# Patient Record
Sex: Male | Born: 1955 | Race: Black or African American | Hispanic: No | Marital: Single
Health system: Southern US, Community
[De-identification: ages and names within clinical notes are randomized; demographics above are authoritative.]

---

## 2003-07-04 ENCOUNTER — Other Ambulatory Visit: Payer: Self-pay

## 2003-09-19 ENCOUNTER — Other Ambulatory Visit: Payer: Self-pay

## 2006-06-02 ENCOUNTER — Ambulatory Visit: Payer: Self-pay | Admitting: Internal Medicine

## 2006-06-02 ENCOUNTER — Other Ambulatory Visit: Payer: Self-pay

## 2006-06-03 ENCOUNTER — Inpatient Hospital Stay: Payer: Self-pay | Admitting: Internal Medicine

## 2006-06-18 ENCOUNTER — Ambulatory Visit: Payer: Self-pay | Admitting: Internal Medicine

## 2006-07-17 ENCOUNTER — Emergency Department (HOSPITAL_COMMUNITY): Admission: EM | Admit: 2006-07-17 | Discharge: 2006-07-18 | Payer: Self-pay | Admitting: Emergency Medicine

## 2007-02-26 ENCOUNTER — Other Ambulatory Visit: Payer: Self-pay

## 2007-02-26 ENCOUNTER — Inpatient Hospital Stay: Payer: Self-pay | Admitting: Internal Medicine

## 2008-05-06 ENCOUNTER — Emergency Department: Payer: Self-pay | Admitting: Emergency Medicine

## 2008-06-09 ENCOUNTER — Ambulatory Visit: Payer: Self-pay | Admitting: Internal Medicine

## 2010-02-06 ENCOUNTER — Emergency Department: Payer: Self-pay | Admitting: Emergency Medicine

## 2010-02-15 ENCOUNTER — Ambulatory Visit: Payer: Self-pay | Admitting: Internal Medicine

## 2010-02-20 ENCOUNTER — Ambulatory Visit: Payer: Self-pay | Admitting: Internal Medicine

## 2010-02-22 ENCOUNTER — Ambulatory Visit: Payer: Self-pay

## 2010-02-27 ENCOUNTER — Ambulatory Visit: Payer: Self-pay | Admitting: Internal Medicine

## 2010-03-06 ENCOUNTER — Ambulatory Visit: Payer: Self-pay | Admitting: Internal Medicine

## 2011-07-02 LAB — COMPREHENSIVE METABOLIC PANEL
Anion Gap: 19 — ABNORMAL HIGH (ref 7–16)
BUN: 41 mg/dL — ABNORMAL HIGH (ref 7–18)
Bilirubin,Total: 1.1 mg/dL — ABNORMAL HIGH (ref 0.2–1.0)
Chloride: 88 mmol/L — ABNORMAL LOW (ref 98–107)
Co2: 18 mmol/L — ABNORMAL LOW (ref 21–32)
Glucose: 84 mg/dL (ref 65–99)
Osmolality: 261 (ref 275–301)
SGOT(AST): 120 U/L — ABNORMAL HIGH (ref 15–37)
SGPT (ALT): 61 U/L

## 2011-07-02 LAB — DRUG SCREEN, URINE
Benzodiazepine, Ur Scrn: NEGATIVE (ref ?–200)
Methadone, Ur Screen: NEGATIVE (ref ?–300)
Opiate, Ur Screen: NEGATIVE (ref ?–300)
Tricyclic, Ur Screen: NEGATIVE (ref ?–1000)

## 2011-07-02 LAB — CBC
HCT: 39.8 % — ABNORMAL LOW (ref 40.0–52.0)
HGB: 13.7 g/dL (ref 13.0–18.0)
MCHC: 34.5 g/dL (ref 32.0–36.0)
RDW: 14 % (ref 11.5–14.5)

## 2011-07-02 LAB — URINALYSIS, COMPLETE
Glucose,UR: NEGATIVE mg/dL (ref 0–75)
Ketone: NEGATIVE
Leukocyte Esterase: NEGATIVE
Specific Gravity: 1.006 (ref 1.003–1.030)
Squamous Epithelial: <1

## 2011-07-02 LAB — TSH: Thyroid Stimulating Horm: 9.24 u[IU]/mL — ABNORMAL HIGH

## 2011-07-03 ENCOUNTER — Inpatient Hospital Stay: Payer: Self-pay | Admitting: Specialist

## 2011-07-03 LAB — BASIC METABOLIC PANEL
Anion Gap: 14 (ref 7–16)
BUN: 25 mg/dL — ABNORMAL HIGH (ref 7–18)
Calcium, Total: 8.1 mg/dL — ABNORMAL LOW (ref 8.5–10.1)
Calcium, Total: 8.2 mg/dL — ABNORMAL LOW (ref 8.5–10.1)
Chloride: 98 mmol/L (ref 98–107)
Chloride: 104 mmol/L (ref 98–107)
Co2: 22 mmol/L (ref 21–32)
Co2: 21 mmol/L (ref 21–32)
Creatinine: 1.01 mg/dL (ref 0.60–1.30)
EGFR (African American): >60
EGFR (Non-African Amer.): >60
Glucose: 83 mg/dL (ref 65–99)
Osmolality: 273 (ref 275–301)
Osmolality: 277 (ref 275–301)
Potassium: 3.2 mmol/L — ABNORMAL LOW (ref 3.5–5.1)
Sodium: 137 mmol/L (ref 136–145)

## 2011-07-03 LAB — CK TOTAL AND CKMB (NOT AT ARMC)
CK, Total: 1756 U/L — ABNORMAL HIGH (ref 35–232)
CK, Total: 1121 U/L — ABNORMAL HIGH (ref 35–232)
CK-MB: 6.7 ng/mL — ABNORMAL HIGH (ref 0.5–3.6)
CK-MB: 3.9 ng/mL — ABNORMAL HIGH (ref 0.5–3.6)
CK-MB: 3.4 ng/mL (ref 0.5–3.6)

## 2011-07-03 LAB — T4, FREE: Free Thyroxine: 0.94 ng/dL (ref 0.76–1.46)

## 2011-07-03 LAB — AMMONIA: Ammonia, Plasma: 25 mcmol/L (ref 11–32)

## 2011-07-03 LAB — TROPONIN I: Troponin-I: 0.15 ng/mL — ABNORMAL HIGH

## 2011-07-03 LAB — FOLATE: Folic Acid: 21.9 ng/mL (ref 3.1–100.0)

## 2011-07-04 LAB — LIPID PANEL: VLDL Cholesterol, Calc: 16 mg/dL (ref 5–40)

## 2011-07-04 LAB — CBC WITH DIFFERENTIAL/PLATELET
Basophil #: 0 10*3/uL (ref 0.0–0.1)
Basophil %: 0.5 %
HCT: 38.6 % — ABNORMAL LOW (ref 40.0–52.0)
HGB: 13 g/dL (ref 13.0–18.0)
Lymphocyte #: 0.9 10*3/uL — ABNORMAL LOW (ref 1.0–3.6)
MCH: 32.1 pg (ref 26.0–34.0)
MCHC: 33.6 g/dL (ref 32.0–36.0)
Neutrophil #: 5.6 10*3/uL (ref 1.4–6.5)
Platelet: 117 10*3/uL — ABNORMAL LOW (ref 150–440)
RBC: 4.04 10*6/uL — ABNORMAL LOW (ref 4.40–5.90)
RDW: 14.2 % (ref 11.5–14.5)

## 2011-07-04 LAB — PROTIME-INR: INR: 1.1

## 2011-07-04 LAB — COMPREHENSIVE METABOLIC PANEL
Albumin: 3.3 g/dL — ABNORMAL LOW (ref 3.4–5.0)
Alkaline Phosphatase: 53 U/L (ref 50–136)
Anion Gap: 12 (ref 7–16)
BUN: 14 mg/dL (ref 7–18)
Bilirubin,Total: 1.3 mg/dL — ABNORMAL HIGH (ref 0.2–1.0)
Calcium, Total: 8.6 mg/dL (ref 8.5–10.1)
Osmolality: 274 (ref 275–301)
SGOT(AST): 79 U/L — ABNORMAL HIGH (ref 15–37)
Sodium: 137 mmol/L (ref 136–145)
Total Protein: 8 g/dL (ref 6.4–8.2)

## 2011-07-04 LAB — MAGNESIUM: Magnesium: 2.2 mg/dL

## 2011-07-04 LAB — HEMOGLOBIN A1C: Hemoglobin A1C: 6.1 % (ref 4.2–6.3)

## 2011-08-27 ENCOUNTER — Inpatient Hospital Stay: Payer: Self-pay | Admitting: *Deleted

## 2011-08-27 LAB — TROPONIN I
Troponin-I: 0.23 ng/mL — ABNORMAL HIGH
Troponin-I: 0.22 ng/mL — ABNORMAL HIGH
Troponin-I: 0.2 ng/mL — ABNORMAL HIGH

## 2011-08-27 LAB — COMPREHENSIVE METABOLIC PANEL
Alkaline Phosphatase: 118 U/L (ref 50–136)
Anion Gap: 9 (ref 7–16)
BUN: 13 mg/dL (ref 7–18)
Bilirubin,Total: 0.8 mg/dL (ref 0.2–1.0)
Calcium, Total: 9.7 mg/dL (ref 8.5–10.1)
Chloride: 98 mmol/L (ref 98–107)
Co2: 27 mmol/L (ref 21–32)
Creatinine: 1.11 mg/dL (ref 0.60–1.30)
EGFR (African American): >60
EGFR (Non-African Amer.): >60
Glucose: 123 mg/dL — ABNORMAL HIGH (ref 65–99)
SGOT(AST): 45 U/L — ABNORMAL HIGH (ref 15–37)
Sodium: 134 mmol/L — ABNORMAL LOW (ref 136–145)
Total Protein: 9.5 g/dL — ABNORMAL HIGH (ref 6.4–8.2)

## 2011-08-27 LAB — ETHANOL: Ethanol: <3 mg/dL

## 2011-08-27 LAB — APTT
Activated PTT: 31.7 secs (ref 23.6–35.9)
Activated PTT: 118.3 secs — ABNORMAL HIGH (ref 23.6–35.9)
Activated PTT: 122.7 secs — ABNORMAL HIGH (ref 23.6–35.9)

## 2011-08-27 LAB — CK TOTAL AND CKMB (NOT AT ARMC)
CK, Total: 256 U/L — ABNORMAL HIGH (ref 35–232)
CK, Total: 366 U/L — ABNORMAL HIGH (ref 35–232)
CK-MB: 3.1 ng/mL (ref 0.5–3.6)
CK-MB: 7.6 ng/mL — ABNORMAL HIGH (ref 0.5–3.6)

## 2011-08-27 LAB — CBC
HGB: 15.1 g/dL (ref 13.0–18.0)
MCV: 95 fL (ref 80–100)
RBC: 4.72 10*6/uL (ref 4.40–5.90)
WBC: 11.9 10*3/uL — ABNORMAL HIGH (ref 3.8–10.6)

## 2011-08-27 LAB — TSH: Thyroid Stimulating Horm: 8.71 u[IU]/mL — ABNORMAL HIGH

## 2011-08-27 LAB — T4, FREE: Free Thyroxine: 0.98 ng/dL (ref 0.76–1.46)

## 2011-08-27 LAB — URINALYSIS, COMPLETE
Leukocyte Esterase: NEGATIVE
Nitrite: NEGATIVE
Protein: NEGATIVE
RBC,UR: NONE SEEN /HPF (ref 0–5)
Squamous Epithelial: NONE SEEN
WBC UR: <1 /HPF (ref 0–5)

## 2011-08-27 LAB — ACETAMINOPHEN LEVEL: Acetaminophen: <2 ug/mL

## 2011-08-27 LAB — DRUG SCREEN, URINE
Barbiturates, Ur Screen: NEGATIVE (ref ?–200)
MDMA (Ecstasy)Ur Screen: NEGATIVE (ref ?–500)
Methadone, Ur Screen: NEGATIVE (ref ?–300)
Phencyclidine (PCP) Ur S: NEGATIVE (ref ?–25)

## 2011-08-27 LAB — PROTIME-INR
INR: 1
Prothrombin Time: 13.2 secs (ref 11.5–14.7)

## 2011-08-28 LAB — COMPREHENSIVE METABOLIC PANEL
Albumin: 3 g/dL — ABNORMAL LOW (ref 3.4–5.0)
Alkaline Phosphatase: 79 U/L (ref 50–136)
BUN: 10 mg/dL (ref 7–18)
Bilirubin,Total: 0.5 mg/dL (ref 0.2–1.0)
Calcium, Total: 8.3 mg/dL — ABNORMAL LOW (ref 8.5–10.1)
Creatinine: 0.67 mg/dL (ref 0.60–1.30)
Osmolality: 280 (ref 275–301)
Potassium: 3.5 mmol/L (ref 3.5–5.1)
SGOT(AST): 37 U/L (ref 15–37)
SGPT (ALT): 26 U/L
Sodium: 141 mmol/L (ref 136–145)
Total Protein: 7.3 g/dL (ref 6.4–8.2)

## 2011-08-28 LAB — CBC WITH DIFFERENTIAL/PLATELET
Eosinophil #: 0.2 10*3/uL (ref 0.0–0.7)
Eosinophil %: 2 %
HCT: 40.4 % (ref 40.0–52.0)
Lymphocyte #: 2.1 10*3/uL (ref 1.0–3.6)
Lymphocyte %: 22.2 %
Monocyte %: 10.7 %
Neutrophil #: 6.2 10*3/uL (ref 1.4–6.5)
Neutrophil %: 64.7 %
RDW: 13.7 % (ref 11.5–14.5)
WBC: 9.6 10*3/uL (ref 3.8–10.6)

## 2011-08-28 LAB — LIPID PANEL
Triglycerides: 51 mg/dL (ref 0–200)
VLDL Cholesterol, Calc: 10 mg/dL (ref 5–40)

## 2011-08-28 LAB — APTT
Activated PTT: 124.3 secs — ABNORMAL HIGH (ref 23.6–35.9)
Activated PTT: 84.9 secs — ABNORMAL HIGH (ref 23.6–35.9)

## 2011-08-28 LAB — MAGNESIUM: Magnesium: 1.5 mg/dL — ABNORMAL LOW

## 2011-08-29 ENCOUNTER — Inpatient Hospital Stay: Payer: Self-pay | Admitting: Psychiatry

## 2011-08-29 LAB — APTT
Activated PTT: 66.5 secs — ABNORMAL HIGH (ref 23.6–35.9)
Activated PTT: 84.9 secs — ABNORMAL HIGH (ref 23.6–35.9)

## 2011-08-29 LAB — MAGNESIUM: Magnesium: 2.1 mg/dL

## 2011-09-19 ENCOUNTER — Emergency Department: Payer: Self-pay | Admitting: Emergency Medicine

## 2011-09-19 LAB — BASIC METABOLIC PANEL
Calcium, Total: 8.3 mg/dL — ABNORMAL LOW (ref 8.5–10.1)
Chloride: 107 mmol/L (ref 98–107)
Co2: 25 mmol/L (ref 21–32)
Glucose: 124 mg/dL — ABNORMAL HIGH (ref 65–99)
Osmolality: 283 (ref 275–301)
Potassium: 3.7 mmol/L (ref 3.5–5.1)

## 2011-09-19 LAB — CBC
HGB: 14.3 g/dL (ref 13.0–18.0)
MCH: 31.9 pg (ref 26.0–34.0)
MCV: 96 fL (ref 80–100)
Platelet: 312 10*3/uL (ref 150–440)
RBC: 4.49 10*6/uL (ref 4.40–5.90)

## 2011-09-19 LAB — CK TOTAL AND CKMB (NOT AT ARMC): CK-MB: 4.4 ng/mL — ABNORMAL HIGH (ref 0.5–3.6)

## 2011-09-20 ENCOUNTER — Inpatient Hospital Stay: Payer: Self-pay | Admitting: Internal Medicine

## 2011-09-20 LAB — COMPREHENSIVE METABOLIC PANEL
Anion Gap: 9 (ref 7–16)
BUN: 11 mg/dL (ref 7–18)
Bilirubin,Total: 0.4 mg/dL (ref 0.2–1.0)
Chloride: 104 mmol/L (ref 98–107)
Creatinine: 0.98 mg/dL (ref 0.60–1.30)
EGFR (African American): >60
Potassium: 3.6 mmol/L (ref 3.5–5.1)
SGPT (ALT): 28 U/L
Total Protein: 9.1 g/dL — ABNORMAL HIGH (ref 6.4–8.2)

## 2011-09-20 LAB — CK TOTAL AND CKMB (NOT AT ARMC): CK, Total: 401 U/L — ABNORMAL HIGH (ref 35–232)

## 2011-09-20 LAB — CBC
HCT: 44.8 % (ref 40.0–52.0)
HGB: 14.7 g/dL (ref 13.0–18.0)
MCH: 31.6 pg (ref 26.0–34.0)
MCHC: 32.9 g/dL (ref 32.0–36.0)
RDW: 13.8 % (ref 11.5–14.5)

## 2011-09-20 LAB — ETHANOL: Ethanol %: 0.379 % (ref 0.000–0.080)

## 2011-09-20 LAB — TROPONIN I: Troponin-I: 0.29 ng/mL — ABNORMAL HIGH

## 2011-09-20 LAB — DRUG SCREEN, URINE
Benzodiazepine, Ur Scrn: NEGATIVE (ref ?–200)
Cannabinoid 50 Ng, Ur ~~LOC~~: NEGATIVE (ref ?–50)
Methadone, Ur Screen: NEGATIVE (ref ?–300)
Phencyclidine (PCP) Ur S: NEGATIVE (ref ?–25)

## 2011-09-21 LAB — CBC WITH DIFFERENTIAL/PLATELET
Basophil %: 0.5 %
Eosinophil #: 0.1 10*3/uL (ref 0.0–0.7)
Eosinophil %: 1.1 %
HGB: 12.9 g/dL — ABNORMAL LOW (ref 13.0–18.0)
Lymphocyte #: 2.2 10*3/uL (ref 1.0–3.6)
MCH: 31.9 pg (ref 26.0–34.0)
MCHC: 33.4 g/dL (ref 32.0–36.0)
Monocyte #: 0.9 x10 3/mm (ref 0.2–1.0)
Neutrophil #: 4.9 10*3/uL (ref 1.4–6.5)

## 2011-09-21 LAB — TSH: Thyroid Stimulating Horm: 2.53 u[IU]/mL

## 2011-09-21 LAB — COMPREHENSIVE METABOLIC PANEL
Anion Gap: 9 (ref 7–16)
Calcium, Total: 8.2 mg/dL — ABNORMAL LOW (ref 8.5–10.1)
Chloride: 105 mmol/L (ref 98–107)
Co2: 28 mmol/L (ref 21–32)
EGFR (African American): >60
EGFR (Non-African Amer.): >60
Potassium: 3.7 mmol/L (ref 3.5–5.1)
SGOT(AST): 30 U/L (ref 15–37)
SGPT (ALT): 22 U/L

## 2011-09-21 LAB — MAGNESIUM: Magnesium: 1.8 mg/dL

## 2011-09-23 LAB — HEMOGLOBIN: HGB: 13.6 g/dL (ref 13.0–18.0)

## 2011-09-23 LAB — PLATELET COUNT: Platelet: 173 10*3/uL (ref 150–440)

## 2011-09-24 LAB — BASIC METABOLIC PANEL
Anion Gap: 5 — ABNORMAL LOW (ref 7–16)
Calcium, Total: 9.2 mg/dL (ref 8.5–10.1)
EGFR (African American): >60
EGFR (Non-African Amer.): >60
Osmolality: 273 (ref 275–301)
Potassium: 3.7 mmol/L (ref 3.5–5.1)

## 2011-09-24 LAB — CBC WITH DIFFERENTIAL/PLATELET
Basophil #: 0 10*3/uL (ref 0.0–0.1)
Eosinophil #: 0.2 10*3/uL (ref 0.0–0.7)
Lymphocyte %: 14.4 %
MCH: 32 pg (ref 26.0–34.0)
MCHC: 33.5 g/dL (ref 32.0–36.0)
Monocyte #: 0.7 x10 3/mm (ref 0.2–1.0)
Neutrophil %: 76.1 %
Platelet: 177 10*3/uL (ref 150–440)
RDW: 13.5 % (ref 11.5–14.5)

## 2011-09-24 LAB — APTT: Activated PTT: 61.2 secs — ABNORMAL HIGH (ref 23.6–35.9)

## 2011-09-25 LAB — BASIC METABOLIC PANEL
Calcium, Total: 9.5 mg/dL (ref 8.5–10.1)
Chloride: 103 mmol/L (ref 98–107)
Co2: 22 mmol/L (ref 21–32)
Creatinine: 0.61 mg/dL (ref 0.60–1.30)
EGFR (African American): >60
Potassium: 3.5 mmol/L (ref 3.5–5.1)
Sodium: 137 mmol/L (ref 136–145)

## 2011-09-25 LAB — CBC WITH DIFFERENTIAL/PLATELET
Basophil #: 0 10*3/uL (ref 0.0–0.1)
Eosinophil %: 1.8 %
HCT: 42.1 % (ref 40.0–52.0)
MCV: 97 fL (ref 80–100)
Monocyte #: 1 x10 3/mm (ref 0.2–1.0)
Monocyte %: 11.9 %
Neutrophil %: 66.5 %
Platelet: 147 10*3/uL — ABNORMAL LOW (ref 150–440)
RDW: 13.3 % (ref 11.5–14.5)
WBC: 8.7 10*3/uL (ref 3.8–10.6)

## 2011-11-09 ENCOUNTER — Emergency Department: Payer: Self-pay | Admitting: Emergency Medicine

## 2012-04-29 ENCOUNTER — Emergency Department: Payer: Self-pay | Admitting: Emergency Medicine

## 2012-04-29 LAB — CBC
HGB: 13.4 g/dL (ref 13.0–18.0)
MCH: 32.9 pg (ref 26.0–34.0)
MCHC: 33.5 g/dL (ref 32.0–36.0)
MCV: 98 fL (ref 80–100)
RBC: 4.07 10*6/uL — ABNORMAL LOW (ref 4.40–5.90)
RDW: 13.9 % (ref 11.5–14.5)
WBC: 5.9 10*3/uL (ref 3.8–10.6)

## 2012-04-29 LAB — COMPREHENSIVE METABOLIC PANEL
BUN: 9 mg/dL (ref 7–18)
Bilirubin,Total: 0.3 mg/dL (ref 0.2–1.0)
Calcium, Total: 8.3 mg/dL — ABNORMAL LOW (ref 8.5–10.1)
Chloride: 108 mmol/L — ABNORMAL HIGH (ref 98–107)
EGFR (African American): >60
Osmolality: 278 (ref 275–301)
SGOT(AST): 58 U/L — ABNORMAL HIGH (ref 15–37)
SGPT (ALT): 50 U/L (ref 12–78)
Sodium: 140 mmol/L (ref 136–145)
Total Protein: 8.3 g/dL — ABNORMAL HIGH (ref 6.4–8.2)

## 2012-04-29 LAB — LIPASE, BLOOD: Lipase: 114 U/L (ref 73–393)

## 2012-04-29 LAB — MAGNESIUM: Magnesium: 1.9 mg/dL

## 2012-04-29 LAB — PROTIME-INR
INR: 0.9
Prothrombin Time: 13 secs (ref 11.5–14.7)

## 2012-04-29 LAB — TROPONIN I: Troponin-I: 0.14 ng/mL — ABNORMAL HIGH

## 2012-04-29 LAB — CK TOTAL AND CKMB (NOT AT ARMC): CK, Total: 306 U/L — ABNORMAL HIGH (ref 35–232)

## 2012-04-30 LAB — CK TOTAL AND CKMB (NOT AT ARMC)
CK, Total: 343 U/L — ABNORMAL HIGH (ref 35–232)
CK-MB: 3.9 ng/mL — ABNORMAL HIGH (ref 0.5–3.6)

## 2012-06-29 ENCOUNTER — Emergency Department: Payer: Self-pay | Admitting: Emergency Medicine

## 2012-06-29 LAB — COMPREHENSIVE METABOLIC PANEL
Albumin: 3.8 g/dL (ref 3.4–5.0)
Alkaline Phosphatase: 94 U/L (ref 50–136)
Co2: 26 mmol/L (ref 21–32)
EGFR (African American): >60
EGFR (Non-African Amer.): >60
Glucose: 102 mg/dL — ABNORMAL HIGH (ref 65–99)
Sodium: 138 mmol/L (ref 136–145)

## 2012-06-29 LAB — ETHANOL
Ethanol %: 0.406 % (ref 0.000–0.080)
Ethanol: 406 mg/dL

## 2012-06-29 LAB — CBC WITH DIFFERENTIAL/PLATELET
Basophil %: 0.6 %
Eosinophil #: 0 10*3/uL (ref 0.0–0.7)
Lymphocyte #: 1.8 10*3/uL (ref 1.0–3.6)
Lymphocyte %: 43.4 %
MCHC: 32.7 g/dL (ref 32.0–36.0)
MCV: 96 fL (ref 80–100)
Monocyte #: 0.5 x10 3/mm (ref 0.2–1.0)
Neutrophil #: 1.8 10*3/uL (ref 1.4–6.5)
RBC: 4.16 10*6/uL — ABNORMAL LOW (ref 4.40–5.90)
WBC: 4.1 10*3/uL (ref 3.8–10.6)

## 2012-06-29 LAB — PROTIME-INR: INR: 1.1

## 2012-07-09 ENCOUNTER — Emergency Department: Payer: Self-pay | Admitting: Emergency Medicine

## 2012-07-09 LAB — COMPREHENSIVE METABOLIC PANEL
Anion Gap: 9 (ref 7–16)
Bilirubin,Total: 0.4 mg/dL (ref 0.2–1.0)
Co2: 24 mmol/L (ref 21–32)
Creatinine: 0.81 mg/dL (ref 0.60–1.30)
EGFR (African American): >60
EGFR (Non-African Amer.): >60
Glucose: 99 mg/dL (ref 65–99)
Potassium: 3.7 mmol/L (ref 3.5–5.1)
SGPT (ALT): 62 U/L (ref 12–78)
Sodium: 137 mmol/L (ref 136–145)
Total Protein: 8.2 g/dL (ref 6.4–8.2)

## 2012-07-09 LAB — CBC
HGB: 11.7 g/dL — ABNORMAL LOW (ref 13.0–18.0)
MCH: 32.2 pg (ref 26.0–34.0)
MCHC: 33.7 g/dL (ref 32.0–36.0)
MCV: 96 fL (ref 80–100)
Platelet: 91 10*3/uL — ABNORMAL LOW (ref 150–440)
RDW: 14 % (ref 11.5–14.5)
WBC: 4.3 10*3/uL (ref 3.8–10.6)

## 2012-07-09 LAB — PROTIME-INR: INR: 1.1

## 2012-07-09 LAB — ETHANOL: Ethanol: 213 mg/dL

## 2012-07-25 ENCOUNTER — Emergency Department: Payer: Self-pay | Admitting: Emergency Medicine

## 2012-07-25 ENCOUNTER — Inpatient Hospital Stay: Payer: Self-pay | Admitting: Student

## 2012-07-25 LAB — BASIC METABOLIC PANEL
Anion Gap: 10 (ref 7–16)
BUN: 14 mg/dL (ref 7–18)
EGFR (African American): >60
Osmolality: 271 (ref 275–301)
Potassium: 3.7 mmol/L (ref 3.5–5.1)

## 2012-07-25 LAB — SALICYLATE LEVEL: Salicylates, Serum: 2.5 mg/dL

## 2012-07-25 LAB — ACETAMINOPHEN LEVEL: Acetaminophen: <2 ug/mL

## 2012-07-25 LAB — ETHANOL: Ethanol: <3 mg/dL

## 2012-07-25 LAB — PRO B NATRIURETIC PEPTIDE: B-Type Natriuretic Peptide: 915 pg/mL — ABNORMAL HIGH (ref 0–125)

## 2012-07-25 LAB — CBC
HCT: 37.4 % — ABNORMAL LOW (ref 40.0–52.0)
HGB: 12.7 g/dL — ABNORMAL LOW (ref 13.0–18.0)
MCHC: 34 g/dL (ref 32.0–36.0)
MCV: 94 fL (ref 80–100)
RDW: 14.3 % (ref 11.5–14.5)
WBC: 6.5 10*3/uL (ref 3.8–10.6)

## 2012-07-25 LAB — CK TOTAL AND CKMB (NOT AT ARMC): CK-MB: 3.3 ng/mL (ref 0.5–3.6)

## 2012-07-25 LAB — TROPONIN I: Troponin-I: 0.25 ng/mL — ABNORMAL HIGH

## 2012-07-26 LAB — COMPREHENSIVE METABOLIC PANEL
Albumin: 3.4 g/dL (ref 3.4–5.0)
Alkaline Phosphatase: 61 U/L (ref 50–136)
Anion Gap: 8 (ref 7–16)
Bilirubin,Total: 0.8 mg/dL (ref 0.2–1.0)
Calcium, Total: 9 mg/dL (ref 8.5–10.1)
Chloride: 106 mmol/L (ref 98–107)
Co2: 24 mmol/L (ref 21–32)
EGFR (African American): >60
EGFR (Non-African Amer.): >60
Glucose: 85 mg/dL (ref 65–99)
Osmolality: 276 (ref 275–301)
Potassium: 3.4 mmol/L — ABNORMAL LOW (ref 3.5–5.1)
SGOT(AST): 59 U/L — ABNORMAL HIGH (ref 15–37)
Sodium: 138 mmol/L (ref 136–145)

## 2012-07-26 LAB — CBC WITH DIFFERENTIAL/PLATELET
Basophil %: 0.4 %
Eosinophil #: 0.1 10*3/uL (ref 0.0–0.7)
Eosinophil %: 0.7 %
Lymphocyte #: 1 10*3/uL (ref 1.0–3.6)
MCHC: 34 g/dL (ref 32.0–36.0)
MCV: 94 fL (ref 80–100)
Monocyte #: 0.8 x10 3/mm (ref 0.2–1.0)
Monocyte %: 9.4 %
Neutrophil #: 7 10*3/uL — ABNORMAL HIGH (ref 1.4–6.5)
Neutrophil %: 78.6 %
RBC: 3.58 10*6/uL — ABNORMAL LOW (ref 4.40–5.90)
RDW: 14.5 % (ref 11.5–14.5)
WBC: 8.9 10*3/uL (ref 3.8–10.6)

## 2012-07-26 LAB — CK TOTAL AND CKMB (NOT AT ARMC)
CK, Total: 242 U/L — ABNORMAL HIGH (ref 35–232)
CK, Total: 198 U/L (ref 35–232)
CK-MB: 2.2 ng/mL (ref 0.5–3.6)
CK-MB: 1.6 ng/mL (ref 0.5–3.6)

## 2012-07-26 LAB — T4, FREE: Free Thyroxine: 1.06 ng/dL (ref 0.76–1.46)

## 2012-07-26 LAB — TROPONIN I: Troponin-I: 0.25 ng/mL — ABNORMAL HIGH

## 2012-07-27 LAB — BASIC METABOLIC PANEL
Anion Gap: 6 — ABNORMAL LOW (ref 7–16)
Chloride: 109 mmol/L — ABNORMAL HIGH (ref 98–107)
Co2: 23 mmol/L (ref 21–32)
Creatinine: 0.73 mg/dL (ref 0.60–1.30)
EGFR (African American): >60
EGFR (Non-African Amer.): >60
Glucose: 93 mg/dL (ref 65–99)
Potassium: 3.2 mmol/L — ABNORMAL LOW (ref 3.5–5.1)
Sodium: 138 mmol/L (ref 136–145)

## 2012-07-27 LAB — MAGNESIUM: Magnesium: 1.6 mg/dL — ABNORMAL LOW

## 2012-07-28 LAB — CBC WITH DIFFERENTIAL/PLATELET
Eosinophil #: 0.2 10*3/uL (ref 0.0–0.7)
Eosinophil %: 2.9 %
HCT: 31.9 % — ABNORMAL LOW (ref 40.0–52.0)
HGB: 10.8 g/dL — ABNORMAL LOW (ref 13.0–18.0)
Lymphocyte #: 1.6 10*3/uL (ref 1.0–3.6)
MCV: 95 fL (ref 80–100)
Monocyte %: 12.4 %
RBC: 3.36 10*6/uL — ABNORMAL LOW (ref 4.40–5.90)
WBC: 6.8 10*3/uL (ref 3.8–10.6)

## 2012-07-28 LAB — BASIC METABOLIC PANEL
Anion Gap: 9 (ref 7–16)
BUN: 11 mg/dL (ref 7–18)
Co2: 21 mmol/L (ref 21–32)
Creatinine: 0.67 mg/dL (ref 0.60–1.30)
EGFR (African American): >60
EGFR (Non-African Amer.): >60
Glucose: 113 mg/dL — ABNORMAL HIGH (ref 65–99)
Osmolality: 274 (ref 275–301)
Potassium: 3.1 mmol/L — ABNORMAL LOW (ref 3.5–5.1)
Sodium: 137 mmol/L (ref 136–145)

## 2012-08-19 ENCOUNTER — Emergency Department: Payer: Self-pay | Admitting: Emergency Medicine

## 2012-08-19 LAB — BASIC METABOLIC PANEL
BUN: 11 mg/dL (ref 7–18)
Creatinine: 0.9 mg/dL (ref 0.60–1.30)
EGFR (African American): >60
Glucose: 94 mg/dL (ref 65–99)
Osmolality: 280 (ref 275–301)
Potassium: 3.9 mmol/L (ref 3.5–5.1)

## 2012-08-19 LAB — CBC
HCT: 33.9 % — ABNORMAL LOW (ref 40.0–52.0)
MCH: 30.8 pg (ref 26.0–34.0)
MCHC: 33.7 g/dL (ref 32.0–36.0)
RDW: 14.8 % — ABNORMAL HIGH (ref 11.5–14.5)
WBC: 7.2 10*3/uL (ref 3.8–10.6)

## 2012-08-19 LAB — PRO B NATRIURETIC PEPTIDE: B-Type Natriuretic Peptide: 347 pg/mL — ABNORMAL HIGH (ref 0–125)

## 2012-08-19 LAB — TROPONIN I: Troponin-I: 0.23 ng/mL — ABNORMAL HIGH

## 2012-08-19 LAB — CK TOTAL AND CKMB (NOT AT ARMC): CK-MB: 3.2 ng/mL (ref 0.5–3.6)

## 2012-08-20 LAB — ETHANOL
Ethanol %: 0.172 % — ABNORMAL HIGH (ref 0.000–0.080)
Ethanol: 172 mg/dL

## 2012-08-22 ENCOUNTER — Emergency Department: Payer: Self-pay | Admitting: Emergency Medicine

## 2012-08-22 LAB — COMPREHENSIVE METABOLIC PANEL
Albumin: 3.4 g/dL (ref 3.4–5.0)
Alkaline Phosphatase: 92 U/L (ref 50–136)
Anion Gap: 7 (ref 7–16)
Calcium, Total: 8.5 mg/dL (ref 8.5–10.1)
Co2: 24 mmol/L (ref 21–32)
Creatinine: 0.78 mg/dL (ref 0.60–1.30)
EGFR (African American): >60
EGFR (Non-African Amer.): >60
Osmolality: 275 (ref 275–301)
Potassium: 3.8 mmol/L (ref 3.5–5.1)
SGOT(AST): 48 U/L — ABNORMAL HIGH (ref 15–37)
SGPT (ALT): 29 U/L (ref 12–78)
Sodium: 139 mmol/L (ref 136–145)
Total Protein: 8.3 g/dL — ABNORMAL HIGH (ref 6.4–8.2)

## 2012-08-22 LAB — CBC
HGB: 11.9 g/dL — ABNORMAL LOW (ref 13.0–18.0)
MCH: 30.4 pg (ref 26.0–34.0)
MCHC: 33.3 g/dL (ref 32.0–36.0)
MCV: 91 fL (ref 80–100)
RBC: 3.9 10*6/uL — ABNORMAL LOW (ref 4.40–5.90)
RDW: 14.2 % (ref 11.5–14.5)
WBC: 8.4 10*3/uL (ref 3.8–10.6)

## 2012-08-22 LAB — URINALYSIS, COMPLETE
Bacteria: NONE SEEN
Blood: NEGATIVE
Glucose,UR: NEGATIVE mg/dL (ref 0–75)
Ketone: NEGATIVE
Leukocyte Esterase: NEGATIVE
Nitrite: NEGATIVE
Ph: 6 (ref 4.5–8.0)
Protein: NEGATIVE
Specific Gravity: 1.003 (ref 1.003–1.030)
Squamous Epithelial: NONE SEEN
WBC UR: NONE SEEN /HPF (ref 0–5)

## 2012-08-22 LAB — PROTIME-INR
INR: 1.1
Prothrombin Time: 14.4 secs (ref 11.5–14.7)

## 2012-08-22 LAB — APTT: Activated PTT: 33.9 secs (ref 23.6–35.9)

## 2012-08-22 LAB — TROPONIN I: Troponin-I: 0.23 ng/mL — ABNORMAL HIGH

## 2012-08-27 ENCOUNTER — Emergency Department: Payer: Self-pay | Admitting: Emergency Medicine

## 2012-08-27 LAB — BASIC METABOLIC PANEL
Calcium, Total: 8.6 mg/dL (ref 8.5–10.1)
Co2: 24 mmol/L (ref 21–32)
Creatinine: 0.68 mg/dL (ref 0.60–1.30)
EGFR (African American): >60
EGFR (Non-African Amer.): >60
Osmolality: 278 (ref 275–301)
Potassium: 3.8 mmol/L (ref 3.5–5.1)
Sodium: 140 mmol/L (ref 136–145)

## 2012-08-27 LAB — CBC
HCT: 34.8 % — ABNORMAL LOW (ref 40.0–52.0)
MCH: 30 pg (ref 26.0–34.0)
MCHC: 32.5 g/dL (ref 32.0–36.0)
Platelet: 204 10*3/uL (ref 150–440)
WBC: 7.5 10*3/uL (ref 3.8–10.6)

## 2012-08-27 LAB — PRO B NATRIURETIC PEPTIDE: B-Type Natriuretic Peptide: 445 pg/mL — ABNORMAL HIGH (ref 0–125)

## 2012-08-27 LAB — CK TOTAL AND CKMB (NOT AT ARMC)
CK, Total: 272 U/L — ABNORMAL HIGH (ref 35–232)
CK-MB: 3.6 ng/mL (ref 0.5–3.6)

## 2012-08-27 LAB — ETHANOL
Ethanol %: 0.222 % — ABNORMAL HIGH (ref 0.000–0.080)
Ethanol: 222 mg/dL

## 2012-08-27 LAB — TROPONIN I: Troponin-I: 0.14 ng/mL — ABNORMAL HIGH

## 2012-10-19 ENCOUNTER — Emergency Department: Payer: Self-pay | Admitting: Emergency Medicine

## 2012-10-19 LAB — ETHANOL: Ethanol: 406 mg/dL

## 2012-10-19 LAB — BASIC METABOLIC PANEL
Anion Gap: 7 (ref 7–16)
Chloride: 107 mmol/L (ref 98–107)
Co2: 25 mmol/L (ref 21–32)
Creatinine: 0.96 mg/dL (ref 0.60–1.30)
EGFR (African American): >60
EGFR (Non-African Amer.): >60
Glucose: 90 mg/dL (ref 65–99)
Osmolality: 279 (ref 275–301)
Potassium: 3.8 mmol/L (ref 3.5–5.1)
Sodium: 139 mmol/L (ref 136–145)

## 2012-10-19 LAB — DRUG SCREEN, URINE
Benzodiazepine, Ur Scrn: NEGATIVE (ref ?–200)
Cocaine Metabolite,Ur ~~LOC~~: NEGATIVE (ref ?–300)
Methadone, Ur Screen: NEGATIVE (ref ?–300)
Opiate, Ur Screen: NEGATIVE (ref ?–300)
Phencyclidine (PCP) Ur S: NEGATIVE (ref ?–25)
Tricyclic, Ur Screen: NEGATIVE (ref ?–1000)

## 2012-10-19 LAB — URINALYSIS, COMPLETE
Bilirubin,UR: NEGATIVE
Leukocyte Esterase: NEGATIVE
Nitrite: NEGATIVE
Specific Gravity: 1.004 (ref 1.003–1.030)
Squamous Epithelial: NONE SEEN

## 2012-10-19 LAB — CBC
HCT: 38.2 % — ABNORMAL LOW (ref 40.0–52.0)
HGB: 13 g/dL (ref 13.0–18.0)
RBC: 4.28 10*6/uL — ABNORMAL LOW (ref 4.40–5.90)
RDW: 18.2 % — ABNORMAL HIGH (ref 11.5–14.5)
WBC: 4.9 10*3/uL (ref 3.8–10.6)

## 2012-10-19 LAB — TROPONIN I
Troponin-I: 0.28 ng/mL — ABNORMAL HIGH
Troponin-I: 0.25 ng/mL — ABNORMAL HIGH

## 2012-11-30 ENCOUNTER — Emergency Department: Payer: Self-pay | Admitting: Emergency Medicine

## 2012-11-30 LAB — URINALYSIS, COMPLETE
Bilirubin,UR: NEGATIVE
Ketone: NEGATIVE
Ph: 5 (ref 4.5–8.0)
Protein: 25
RBC,UR: NONE SEEN /HPF (ref 0–5)
Specific Gravity: 1.005 (ref 1.003–1.030)
Squamous Epithelial: NONE SEEN
WBC UR: NONE SEEN /HPF (ref 0–5)

## 2012-11-30 LAB — CBC
HCT: 40.1 % (ref 40.0–52.0)
HGB: 13.6 g/dL (ref 13.0–18.0)
MCH: 30.7 pg (ref 26.0–34.0)
MCHC: 34 g/dL (ref 32.0–36.0)
Platelet: 97 10*3/uL — ABNORMAL LOW (ref 150–440)
RBC: 4.44 10*6/uL (ref 4.40–5.90)
RDW: 18.9 % — ABNORMAL HIGH (ref 11.5–14.5)
WBC: 4.8 10*3/uL (ref 3.8–10.6)

## 2012-11-30 LAB — COMPREHENSIVE METABOLIC PANEL
Albumin: 3.8 g/dL (ref 3.4–5.0)
Alkaline Phosphatase: 96 U/L (ref 50–136)
Anion Gap: 8 (ref 7–16)
BUN: 11 mg/dL (ref 7–18)
Bilirubin,Total: 0.3 mg/dL (ref 0.2–1.0)
Calcium, Total: 8.6 mg/dL (ref 8.5–10.1)
Co2: 24 mmol/L (ref 21–32)
EGFR (African American): >60
EGFR (Non-African Amer.): >60
Osmolality: 271 (ref 275–301)
Potassium: 3.9 mmol/L (ref 3.5–5.1)
SGOT(AST): 165 U/L — ABNORMAL HIGH (ref 15–37)
SGPT (ALT): 89 U/L — ABNORMAL HIGH (ref 12–78)
Sodium: 136 mmol/L (ref 136–145)
Total Protein: 8.7 g/dL — ABNORMAL HIGH (ref 6.4–8.2)

## 2012-11-30 LAB — TROPONIN I: Troponin-I: 0.3 ng/mL — ABNORMAL HIGH

## 2012-11-30 LAB — PROTIME-INR: Prothrombin Time: 13.6 secs (ref 11.5–14.7)

## 2012-12-18 ENCOUNTER — Emergency Department: Payer: Self-pay | Admitting: Emergency Medicine

## 2012-12-18 LAB — CBC
HCT: 39.2 % — ABNORMAL LOW (ref 40.0–52.0)
HGB: 13.2 g/dL (ref 13.0–18.0)
MCHC: 33.7 g/dL (ref 32.0–36.0)
MCV: 94 fL (ref 80–100)
Platelet: 135 10*3/uL — ABNORMAL LOW (ref 150–440)
RBC: 4.18 10*6/uL — ABNORMAL LOW (ref 4.40–5.90)
RDW: 17.5 % — ABNORMAL HIGH (ref 11.5–14.5)

## 2012-12-18 LAB — COMPREHENSIVE METABOLIC PANEL
Alkaline Phosphatase: 95 U/L (ref 50–136)
Anion Gap: 9 (ref 7–16)
BUN: 11 mg/dL (ref 7–18)
Bilirubin,Total: 0.3 mg/dL (ref 0.2–1.0)
Calcium, Total: 8.8 mg/dL (ref 8.5–10.1)
Creatinine: 0.84 mg/dL (ref 0.60–1.30)
EGFR (African American): >60
EGFR (Non-African Amer.): >60
Glucose: 104 mg/dL — ABNORMAL HIGH (ref 65–99)
Osmolality: 268 (ref 275–301)
Potassium: 4.2 mmol/L (ref 3.5–5.1)
SGOT(AST): 88 U/L — ABNORMAL HIGH (ref 15–37)
SGPT (ALT): 69 U/L (ref 12–78)
Sodium: 134 mmol/L — ABNORMAL LOW (ref 136–145)
Total Protein: 8.2 g/dL (ref 6.4–8.2)

## 2012-12-18 LAB — ETHANOL
Ethanol %: 0.408 % (ref 0.000–0.080)
Ethanol: 408 mg/dL

## 2012-12-19 LAB — URINALYSIS, COMPLETE
Bacteria: NONE SEEN
Blood: NEGATIVE
Glucose,UR: NEGATIVE mg/dL (ref 0–75)
Ketone: NEGATIVE
Nitrite: NEGATIVE
Protein: NEGATIVE

## 2012-12-19 LAB — DRUG SCREEN, URINE
Barbiturates, Ur Screen: NEGATIVE (ref ?–200)
Benzodiazepine, Ur Scrn: NEGATIVE (ref ?–200)
Cocaine Metabolite,Ur ~~LOC~~: NEGATIVE (ref ?–300)
MDMA (Ecstasy)Ur Screen: NEGATIVE (ref ?–500)
Methadone, Ur Screen: NEGATIVE (ref ?–300)
Opiate, Ur Screen: NEGATIVE (ref ?–300)
Phencyclidine (PCP) Ur S: NEGATIVE (ref ?–25)

## 2012-12-19 LAB — TROPONIN I: Troponin-I: 0.21 ng/mL — ABNORMAL HIGH

## 2013-02-02 ENCOUNTER — Emergency Department: Payer: Self-pay | Admitting: Emergency Medicine

## 2013-02-02 LAB — CBC WITH DIFFERENTIAL/PLATELET
Basophil #: 0.1 10*3/uL (ref 0.0–0.1)
Eosinophil #: 0.1 10*3/uL (ref 0.0–0.7)
Eosinophil %: 1 %
HCT: 38.8 % — ABNORMAL LOW (ref 40.0–52.0)
Lymphocyte #: 1.7 10*3/uL (ref 1.0–3.6)
Lymphocyte %: 31.9 %
MCH: 32.3 pg (ref 26.0–34.0)
MCHC: 33.8 g/dL (ref 32.0–36.0)
MCV: 96 fL (ref 80–100)
Monocyte %: 15.2 %
Neutrophil #: 2.7 10*3/uL (ref 1.4–6.5)
RBC: 4.06 10*6/uL — ABNORMAL LOW (ref 4.40–5.90)
RDW: 15.8 % — ABNORMAL HIGH (ref 11.5–14.5)
WBC: 5.3 10*3/uL (ref 3.8–10.6)

## 2013-02-02 LAB — BASIC METABOLIC PANEL
Anion Gap: 6 — ABNORMAL LOW (ref 7–16)
BUN: 16 mg/dL (ref 7–18)
Calcium, Total: 8.4 mg/dL — ABNORMAL LOW (ref 8.5–10.1)
Chloride: 106 mmol/L (ref 98–107)
Creatinine: 0.82 mg/dL (ref 0.60–1.30)
EGFR (African American): >60
EGFR (Non-African Amer.): >60
Glucose: 101 mg/dL — ABNORMAL HIGH (ref 65–99)
Osmolality: 279 (ref 275–301)
Potassium: 4.1 mmol/L (ref 3.5–5.1)
Sodium: 139 mmol/L (ref 136–145)

## 2013-02-02 LAB — ETHANOL: Ethanol: 443 mg/dL

## 2013-02-03 LAB — URINALYSIS, COMPLETE
Bacteria: NONE SEEN
Bilirubin,UR: NEGATIVE
Blood: NEGATIVE
Glucose,UR: NEGATIVE mg/dL (ref 0–75)
Nitrite: NEGATIVE
Ph: 5 (ref 4.5–8.0)
Protein: NEGATIVE
RBC,UR: <1 /HPF (ref 0–5)
Specific Gravity: 1.003 (ref 1.003–1.030)
Squamous Epithelial: NONE SEEN
WBC UR: NONE SEEN /HPF (ref 0–5)

## 2013-02-03 LAB — PROTIME-INR
INR: 1.1
Prothrombin Time: 14 secs (ref 11.5–14.7)

## 2013-02-03 LAB — HEPATIC FUNCTION PANEL A (ARMC)
Bilirubin, Direct: 0.1 mg/dL (ref 0.00–0.20)
SGPT (ALT): 89 U/L — ABNORMAL HIGH (ref 12–78)
Total Protein: 8.5 g/dL — ABNORMAL HIGH (ref 6.4–8.2)

## 2013-02-03 LAB — TROPONIN I: Troponin-I: 0.28 ng/mL — ABNORMAL HIGH

## 2013-02-03 LAB — AMMONIA: Ammonia, Plasma: <25 mcmol/L (ref 11–32)

## 2013-06-02 ENCOUNTER — Inpatient Hospital Stay: Payer: Self-pay | Admitting: Specialist

## 2013-06-02 LAB — DRUG SCREEN, URINE

## 2013-06-02 LAB — CBC
HCT: 47.2 % (ref 40.0–52.0)
HGB: 15.5 g/dL (ref 13.0–18.0)
MCH: 31.7 pg (ref 26.0–34.0)
MCHC: 32.9 g/dL (ref 32.0–36.0)
MCV: 96 fL (ref 80–100)
Platelet: 102 10*3/uL — ABNORMAL LOW (ref 150–440)
RBC: 4.9 10*6/uL (ref 4.40–5.90)
RDW: 15.3 % — AB (ref 11.5–14.5)
WBC: 7.5 10*3/uL (ref 3.8–10.6)

## 2013-06-02 LAB — PHOSPHORUS: PHOSPHORUS: 3.2 mg/dL (ref 2.5–4.9)

## 2013-06-02 LAB — SGOT (AST)(ARMC): SGOT(AST): 114 U/L — ABNORMAL HIGH (ref 15–37)

## 2013-06-02 LAB — MAGNESIUM: Magnesium: 1.8 mg/dL

## 2013-06-02 LAB — ETHANOL
Ethanol %: <0.003 % (ref 0.000–0.080)
Ethanol: <3 mg/dL

## 2013-06-02 LAB — BASIC METABOLIC PANEL
Anion Gap: 9 (ref 7–16)
BUN: 12 mg/dL (ref 7–18)
Calcium, Total: 9.1 mg/dL (ref 8.5–10.1)
Chloride: 101 mmol/L (ref 98–107)
Co2: 24 mmol/L (ref 21–32)
Creatinine: 0.97 mg/dL (ref 0.60–1.30)
Glucose: 124 mg/dL — ABNORMAL HIGH (ref 65–99)
OSMOLALITY: 269 (ref 275–301)
Potassium: 3.2 mmol/L — ABNORMAL LOW (ref 3.5–5.1)
Sodium: 134 mmol/L — ABNORMAL LOW (ref 136–145)

## 2013-06-02 LAB — TSH: Thyroid Stimulating Horm: 5.21 u[IU]/mL — ABNORMAL HIGH

## 2013-06-02 LAB — ALKALINE PHOSPHATASE: Alkaline Phosphatase: 88 U/L

## 2013-06-02 LAB — ALT: SGPT (ALT): 84 U/L — ABNORMAL HIGH (ref 12–78)

## 2013-06-03 LAB — BASIC METABOLIC PANEL
Anion Gap: 2 — ABNORMAL LOW (ref 7–16)
BUN: 19 mg/dL — AB (ref 7–18)
CALCIUM: 8.4 mg/dL — AB (ref 8.5–10.1)
Chloride: 104 mmol/L (ref 98–107)
Co2: 28 mmol/L (ref 21–32)
Creatinine: 1.01 mg/dL (ref 0.60–1.30)
EGFR (Non-African Amer.): >60
Glucose: 115 mg/dL — ABNORMAL HIGH (ref 65–99)
Osmolality: 271 (ref 275–301)
POTASSIUM: 3.7 mmol/L (ref 3.5–5.1)
Sodium: 134 mmol/L — ABNORMAL LOW (ref 136–145)

## 2013-09-16 DEATH — deceased

## 2014-07-09 NOTE — Discharge Summary (Signed)
PATIENT NAME:  Johnathan Blake, Johnathan Blake MR#:  [id number] DATE OF BIRTH:  Feb 10, 1956  DATE OF ADMISSION:  07/25/2012 DATE OF DISCHARGE:  07/29/2012  CONSULTANTS: Dr. Gwen PoundsKowalski from cardiology, Dr. Guss Bundehalla from psychiatry.   CHIEF COMPLAINT: Hallucination.   DISCHARGE DIAGNOSES: 1.  Alcohol withdrawal.  2.  Alcoholic cardiomyopathy.  3.  Chronic systolic congestive heart failure with ejection fraction of 20% to 25% which is significantly decreased from last year.  4.  Elevated troponin, likely demand ischemia, status post cardiac catheterization showing no significant coronary artery disease.  5.  Depression.  6.  Hypothyroidism.  7.  Hypokalemia.  8.  History of alcohol withdrawals in the past.  9.  History of angina and history of deep vein thrombosis.  10.  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   DISCHARGE MEDICATIONS: Folic acid 1 mg daily, lisinopril 20 mg daily, aspirin 81 mg daily, isosorbide mononitrate 60 mg once a day, pantoprazole 40 mg daily, atorvastatin 80 mg daily, carvedilol 12.5 mg 2 times a day, levothyroxine 75 mcg daily. Librium 30 mg 1 day, then 20 mg daily for 1 day, then 10 mg daily for 1 day, then stop.   DIET: Low sodium, low fat, low cholesterol.   ACTIVITY: As tolerated.   FOLLOWUP: Please follow with PCP and cardiologist within 1 to 2 weeks.   DISPOSITION: Home.   CODE STATUS: Full code.   SIGNIFICANT LABS AND IMAGING: Echocardiogram showed EF of 20% to 25% with moderately dilated left atrium and right atrium, moderate LVH, moderately increased LV internal cavity size with moderate to severe mitral regurg and moderate tricuspid regurg. Cardiac cath showing severe left-sided LV dysfunction, no evidence of pulmonary hypertension, no significant CAD. Initial BNP was 915. BUN 14, creatinine 1.08, sodium 135. Troponins were mildly elevated at 0.25 range x 4. CK-MB was negative x 3. TSH was 9.9. Initial hemoglobin 12.7, WBC 6.5. Free T3 was 3.3. Serum salicylates and  acetaminophen level not elevated significantly. Last hemoglobin of 10.8.   HISTORY OF PRESENT ILLNESS AND HOSPITAL COURSE: For full details of H and P, please see the dictation on May 9 by Dr. Luberta MutterKonidena but, briefly, this is a pleasant 59 year old African American male who was brought in by girlfriend for detox. The patient apparently was brought in initially in the ER, was noted to have tachycardia and elevated troponin but the patient walked out AMA from the ER and was found in the bathroom and had some hallucinations and was brought in and the family then wanted detox from alcohol. He drank heavily in the past and has had withdrawals and, therefore, was admitted to the hospitalist service with CIWA protocol. He drinks 40 ounces of liquor as well as some beer per his girlfriend. His troponins were cycled  which remained stable; however, an echocardiogram was obtained which showed depressed LV from before. Of note, the patient has had a cardiac cath which showed a normal EF, no significant CAD. He was seen by Dr. Gwen PoundsKowalski and, given the depressed EF and some valvular disease, he was taken to the cath lab which again showed no significant CAD; however the patient likely has alcoholic cardiomyopathy as the echocardiogram did show dilation in several chambers. He was continued on the CIWA protocol while here and then, given that he also had significant risk for withdrawals, he was started on Librium standing and will be discharged with a taper. At this point, he has had no significant chest pains, shortness of breath, is on room air,  does not appear to be volume overloaded and is in stable chronic systolic CHF. He appears to be calm and no significant withdrawal at this point and, therefore, will be discharged.   TOTAL TIME SPENT: 35 minutes.   ____________________________ Krystal Eaton, MD sa:cs D: 07/29/2012 18:16:00 ET T: 07/29/2012 19:54:14 ET JOB#: [id number]  cc: Krystal Eaton, MD, <Dictator> Krystal Eaton MD ELECTRONICALLY SIGNED 08/19/2012 12:27

## 2014-07-09 NOTE — Consult Note (Signed)
PATIENT NAME:  Johnathan Blake, Johnathan Blake MR#:  [id number] DATE OF BIRTH:  13-Mar-1956  DATE OF CONSULTATION:  07/25/2012  REFERRING PHYSICIAN:  Dr. Amado Coe.  CONSULTING PHYSICIAN:  Lamar Blinks, MD  REASON FOR CONSULTATION:  Hypertension, supraventricular tachycardia, cardiovascular disease with coronary artery disease and previous myocardial infarction with elevated troponin and symptoms of acute onset of congestive heart failure.   CHIEF COMPLAINT:  "I'm short of breath."  HISTORY OF PRESENT ILLNESS:  This is a 59 year old male with known coronary artery disease in the past with an old myocardial infarction and a history of hypertension and supraventricular tachycardia. The patient has had new onset of shortness of breath concerning for cardiovascular disease and/or acute congestive heart failure. The patient has had progression of shortness of breath over a several-week period culminating in his being seen in the Emergency Room. At this time the patient does have some minimal symptoms of congestive heart failure with some trace lower extremity edema as well. The patient has had an EKG showing normal sinus rhythm with left bundle branch block and unknown changes from before with an elevated troponin of 0.24 consistent with demand ischemia of acute congestive heart failure. The patient remains feeling well at this time with no further chest discomfort or shortness of breath.   REVIEW OF SYSTEMS:  The remainder review of systems negative for vision change, ringing in the ears, hearing loss, cough, congestion, heartburn, nausea, vomiting, diarrhea, bloody stools, stomach pain, extremity pain, leg weakness, cramping of the buttocks, known blood clots, headaches, blackouts, dizzy spells, nosebleeds, congestion, trouble swallowing, frequent urination, urination at night, muscle weakness, numbness, anxiety, depression, skin lesions or skin rash.   PAST MEDICAL HISTORY:  1.  Supraventricular tachycardia. 2.  Old  myocardial infarction.  3.  Hypertension.  4.  Coronary artery disease.   FAMILY HISTORY:  No family members with early onset of cardiovascular disease or hypertension.   SOCIAL HISTORY:  He currently denies alcohol or tobacco use.   ALLERGIES:  As listed.   MEDICATIONS:  As listed.   PHYSICAL EXAMINATION:  VITAL SIGNS:  Blood pressure is 110/68 bilaterally, heart rate is 72 upright, reclining, and regular. He is a well appearing male in no acute distress.  HEENT:  No icterus, thyromegaly, ulcers, hemorrhages, or xanthelasma.  CARDIOVASCULAR:  Regular rate and rhythm with normal S1, S2, without murmur, gallop, or rub. PMI is diffuse. Carotid upstroke normal without bruit. Jugular venous distention pressure is normal.  LUNGS:  Bibasilar crackles with normal respirations.  ABDOMEN:  Soft, nontender, without hepatosplenomegaly or mass. Abdominal aorta is normal size without bruit.  EXTREMITIES:  Show 2+ bilateral pulses in dorsal, pedal, radial, and femoral arteries without lower extremity with trace lower extremity edema. No cyanosis, clubbing, or ulcers.  NEUROLOGIC:  He is oriented to time, place, and person with normal mood and affect.   ASSESSMENT:  A 59 year old male with shortness of breath, hypertension, coronary artery disease, old myocardial infarction with acute congestive heart failure, elevated troponin consistent with demand ischemia, hypertension, needing further medication management.   RECOMMENDATIONS:  1.  Continue serial ECG and enzymes to assess for intensive myocardial infarction versus continued demand ischemia.  2.  Continue medication management including beta blocker and ACE inhibitor for cardiomyopathy-type symptoms and/or acute congestive heart failure.  3.  Echocardiogram for assessment of systolic versus diastolic congestive heart failure.  4.  Intravenous Lasix as necessary for congestive heart failure and edema.  5.  Begin ambulation with adjustments of  medications  as necessary for further risk reduction in symptoms listed above. 6.  Other diagnostic testing including the possibility of cardiac catheterization depending on ambulation and other significant symptoms.  7.  Further diagnostic testing and treatment options after ambulation.   ____________________________ Lamar BlinksBruce J. Micahel Omlor, MD bjk:jm D: 07/26/2012 11:21:08 ET T: 07/26/2012 17:04:38 ET JOB#: [id number]  cc: Lamar BlinksBruce J. Edel Rivero, MD, <Dictator> Lamar BlinksBRUCE J Ericka Marcellus MD ELECTRONICALLY SIGNED 07/28/2012 13:47

## 2014-07-09 NOTE — H&P (Signed)
PATIENT NAME:  Johnathan Blake, Johnathan Blake MR#:  [id number] DATE OF BIRTH:  08-23-1955  DATE OF ADMISSION:  07/25/2012  PRIMARY DOCTOR:  Dr. Bethann PunchesMark Miller  ER PHYSICIAN:  Dr. Clemens Catholicagsdale  CHIEF COMPLAINT:  Hallucinations.   HISTORY OF PRESENT ILLNESS: The patient is a 59 year old male with history of heavy alcohol abuse. Was seen in the Emergency Department earlier today because of confusion. He was brought in by his girlfriend, and then patient told the ER physician that he (is feeling sick, and he cannot elaborate further. He has had tachycardia, with heart rate 248. The patient also had some elevated troponins this morning, with troponin of 0.24, but patient signed out AMA, and then later on he was found in the men's bathroom, where he thought people were coming behind him, and he was very confused at times, so the staff brought him here back, and the ER physician evaluated him and thought that he was withdrawing from alcohol, so I was asked to admit the patient. The patient unable to give much history now. The patient says that he drinks heavily, last drink was yesterday, but other than that he denies any chest pain or trouble breathing. Quite shaky. The patient thinks somebody is trying to steal things from his house, but other than that unable to give any history. History obtained from patient's son. According to son, he drinks heavily all the time for the past few years, and has been admitted to hospital multiple times for the same complaints, for alcohol detox. The patient's son requesting rehab for alcohol detox, and wants detox program after he is through the withdrawal.   PAST MEDICAL HISTORY:  Significant for history of admission to detox in June, [facility]. Patient was admitted to Medicine on June 12 and  discharged to the outside unit on June 14, and [facility]. [facility] also [facility] for about a month after  that. According to son, he was abstinent from alcohol for 6 months, but restarted again, so the son really wants help this time.  Past medical history significant for hyperlipidemia, diastolic heart failure, history of angina. The patient had a cardiac cath last year in August, which was showing minimal coronary artery disease, EF around 50% to 55%.  Dr. Juliann Paresallwood has seen the patient, and troponins were at least at baseline, 0.13. History of multiple admissions for alcohol detox and chest pain. Past medical history also includes a history of dementia, hypertension, angina, history of DVT, artificial eye on the left side.   ALLERGIES:  No known allergies.   FAMILY HISTORY:  Cardiac disease.   SOCIAL HISTORY: Lives with girlfriend. Heavy drinker, about 40 ounces liquor every day.   MEDICATIONS AT THIS TIME:   Aspirin 81 mg daily, atorvastatin 80 mg p.o. daily, folic acid 1 mg p.o. daily, Imdur 60 mg p.o. daily, lisinopril 20 mg p.o. daily, pantoprazole 40 mg p.o. daily.   REVIEW OF SYSTEMS: Unable to obtain because he is confused. Not in obvious distress, but does have some shaking and also hallucinating, thinking somebody was there at his home and trying to steal things.   VITAL SIGNS:  Temperature 98.3, heart rate when he came before was 243, but repeat vitals shows heart rate around 98, blood pressure 147/87.  HEENT:  Head atraumatic, normocephalic. He is quite tremulous. Family says that he is very shaky and also prone to falls.  Pupils equal, reacting to light. Left eye has prosthesis. Has some clear drainage observed from the left eye.  NECK:  Supple. No JVD.  RESPIRATORY:  Clear to auscultation. No wheeze. No rales.  CARDIOVASCULAR:  Slightly tachycardic. PMI not displaced. No JVD. Rate is regular. Peripheral pulses present.   ABDOMEN:  Soft, nontender, nondistended. Bowel sounds present.  EXTREMITIES:  No extremity edema. No cyanosis. No clubbing.  NEUROLOGIC: The patient is disoriented to  time, place and person.   LABS:  Troponin is 0.25. Acetaminophen less than 2. Alcohol level less than 0.003.  Salicylates 2.5. TSH 9.99. Electrolytes:  Sodium 135, potassium 3.7, chloride 101, bicarb 24, BUN 14, creatinine 1, glucose 105. WBC 6.5, hemoglobin 12.7, hematocrit 7.4, platelets 256. Initial troponin that was done this afternoon 0.24. BNP that was done this afternoon 915. The patient had a chest x-ray that showed no acute cardiopulmonary abnormality.  EKG showed sinus tach with some PACs, 105 beats per minute, has LVH, T-wave inversions in V5 and V6. The patient had these T-wave inversions in V5 and V6 leads before as well, February 11 EKG.   ASSESSMENT AND PLAN: 1. The patient is a 59 year old male with alcohol withdrawal, and patient had some hallucinations this afternoon, so we will continue CIWA protocol, continue seizure precautions. The patient will be monitored on the monitor.psych consult when stable for alcohol  deox  2. Elevated cardiac enzymes. Patient probably had elevated cardiac enzymes in the setting of rhabdomyolysis, and patient may have had a fall at home. The patient had a cardiac cath and evaluation by Dr. Juliann Pares before. Ejection fraction was around 55%, and patient will have an echo at this time, and will continue aspirin, metoprolol and statins,  continue IV fluids,  follow the troponins, and consult Cardiology.   3.  Hypertension.continue metoprolol. Continue that and see how he does.   4.  History of deep vein thrombosis and pulmonary embolism before. Had an IVC filter placed. The patient is high risk for falls because of his shaking due to heavy alcohol abuse.  5.hypothyrodism;continue synthyroid  Time spent is about 55 minutes.  CODE STATUS:   FULL CODE.   Discussed the plan with patient and patient's son in detail.    ____________________________ Katha Hamming, MD sk:mr D: 07/25/2012 18:57:00 ET T: 07/25/2012 20:58:02  ET JOB#: [id number]  cc: Katha Hamming, MD, <Dictator> Katha Hamming MD ELECTRONICALLY SIGNED 08/01/2012 18:57

## 2014-07-09 NOTE — Consult Note (Signed)
PATIENT NAME:  Johnathan Blake, Rylend L MR#:  [id number] DATE OF BIRTH:  30-Jan-1956  DATE OF CONSULTATION:  07/27/2012  REFERRING PHYSICIAN:   CONSULTING PHYSICIAN:  Sansa Alkema K. Sunya Humbarger, MD  AGE:  59 years.   SEX:  Male   RACE:  African American   REASON FOR CONSULTATION:  The patient was seen in consultation for alcohol detox and withdrawals.    SUBJECTIVE:  The patient is a 59 year old African American male, not employed and is on [employer] because he cannot work and did not give the reason.  The patient is divorced and lives with his girlfriend.  The patient reports that he has been drinking alcohol for many years, since he was a teenager.  He currently reports that he drinks two 40 ounces of beer of everyday and sometimes more than that.  Longest period of sobriety happens to be 4 months and the reason of sobriety was he did not have any money to drink.  The patient reports that he goes to [employer] meetings and he is being followed by AA, but does not have any sponsor.   CHIEF COMPLAINT:  "I did not have money to drink and I was afraid I'm going through withdrawal and I came here for help."    PAST PSYCHIATRIC HISTORY:  As stated above, he has a long history of drinking alcohol.  He was inpatient for alcohol withdrawal on numerous occasions.  Denies any other drug abuse., denies IV drugs, denies smoking nicotine cigarettes.    MENTAL STATUS EXAMINATION:  The patient is alert and he knew the date and the time with prompting and help.  Denies feeling depressed.  Denies feeling hopeless or helpless.  Denies feeling worthless or useless.  Denies any auditory or visual hallucinations.  Denies any paranoid or suspicious ideas.  He did not seem have insight into being clean as he reports that the only time he stays clean is when he does not have money to drink.  Insight and judgement guarded, was poor for his alcohol drinking.   IMPRESSION:  Alcohol dependence, chronic, continuous with intoxication.     RECOMMEND:  Continue CIWA protocol.  If the patient is interested in getting help, he can be recommended for substance abuse counseling and follow up in any treatment program.     ____________________________ Jannet MantisSurya K. Guss Bundehalla, MD skc:cc D: 07/26/2012 22:35:58 ET T: 07/27/2012 16:00:02 ET JOB#: [id number]  cc: Monika SalkSurya K. Guss Bundehalla, MD, <Dictator> Beau FannySURYA K Amarrion Pastorino MD ELECTRONICALLY SIGNED 07/27/2012 19:57

## 2014-07-10 NOTE — H&P (Signed)
PATIENT NAME:  Johnathan Blake, Johnathan Blake DATE OF BIRTH:  1955/12/09  DATE OF ADMISSION:  06/02/2013  [facility]IC[facility], Dr. Fulton MoleHarriett Burns.   CHIEF COMPLAINT: Possible seizure, alcohol-related. Witnessed by girlfriend at home.   Johnathan Blake is a pleasant is a 59 year old African American gentleman with history of chronic alcoholism, history of deep vein thrombosis, GI bleed, reflux. History of MI and congestive heart failure and history of glass left eye surgery, comes to the Emergency Room after a EMS was called by girlfriend. The patient's girlfriend reported he has been drinking about a 6 pack every day and noticed some shaking possible seizures. When EMS evaluated pt, he was found to be in postictal state. He was found also to have elevated blood pressure 176/111. The patient was started on IV fluids, received IV Ativan. He answered appropriate basic questions at this time. No seizures witnessed in the Emergency Room. He is being admitted for further evaluation and management.   PAST MEDICAL HISTORY: 1. Past history of alcohol-induced cardiomyopathy with chronic systolic congestive heart failure. Ejection fraction 20% to 25%.  2. Depression.  3. History of hypothyroidism.  4. Alcohol withdrawal and DTs in the past.  5. History of angina.  6. History of deep vein thrombosis.  7. History of artificial glass eye on the left.   MEDICATIONS:  The patient reports he is taking some blood pressure medication and not have the list at this time.   SOCIAL HISTORY: He drinks about a 6 packs of alcohol on a daily basis. The patient denies any history of smoking. Denies any drug use.   FAMILY HISTORY: Cardiac disease.   ALLERGIES: No known drug allergies.   MEDICATIONS: The patient takes some blood pressure medications which still needs to be reconciled.   REVIEW OF SYSTEMS:  CONSTITUTIONAL: No fever, fatigue, weakness.  EYES: No blurred or double vision. No glaucoma or  cataracts.  ENT: No tinnitus, ear pain, hearing loss, epistaxis, discharge.  RESPIRATORY: No cough, wheeze, hemoptysis or COPD.  CARDIOVASCULAR: No chest pain. Positive for hypertension. No arrhythmia or dyspnea on exertion.  GASTROINTESTINAL: No nausea, vomiting, diarrhea, abdominal pain or hematemesis.  GENITOURINARY: No dysuria, hematuria, or frequency.  ENDOCRINE: No polyuria, nocturia or thyroid problems.  HEMATOLOGY: No anemia or easy bruising or bleeding.  SKIN: No acne or rash.  MUSCULOSKELETAL: Positive for arthritis and back pain.  NEUROLOGIC: No CVA or TIA. Positive for seizures witnessed at home.  PSYCHIATRIC: Positive for history of depression. No anxiety or bipolar disorder. All other systems reviewed and negative.   PHYSICAL EXAMINATION: GENERAL: The patient is awake. He answers appropriate questions; however, falls back to sleep. Received Ativan earlier. He is afebrile. Pulse is 72, blood pressure is 176/111. Sats are 98% on room air.  HEENT: Atraumatic, normocephalic. Pupils: PERRLA EOM intact. Oral mucosa is moist.  NECK: Supple. No JVD. No carotid bruit.  LUNGS: Clear to auscultation bilaterally. No rales, rhonchi, respiratory distress or labored breathing.  CARDIOVASCULAR: Both heart sounds are normal. Rate, rhythm regular. PMI not lateralized. Chest nontender.  EXTREMITIES: Good pedal pulses, good femoral pulses. No lower extremity edema.  ABDOMEN: Soft, benign, nontender. No organomegaly. Positive bowel sounds.  NEUROLOGIC: The patient is somewhat sleepy secondary to Ativan received earlier. He moves all extremities well.  No obvious cranial nerve deficits noted. The patient has artificial left eye. Reflexes are 1+ in both upper and lower extremities. Sensory exam cannot be tested. Motor power appears to be stable  with the patient moving all extremities well.  SKIN: Warm and dry.   PSYCHIATRIC: The patient is alert. He is, however, a little bit sleepy since he received  Ativan earlier.   CT of the head stable atrophy with periventricular small vessel disease. No intracranial mass hemorrhage or acute-appearing infarct. CBC within normal limits. Basic metabolic panel within normal limits except potassium of 3.2, sodium 134.   ASSESSMENT: A 59 year old Johnathan Blake with history of alcohol-induced cardiomyopathy with chronic alcoholism, history of gastroesophageal reflux disease and hypertension who comes to the Emergency Room with:  1. Suspected alcohol withdrawal seizures. The patient has history of chronic alcoholism. Seizure "shaking" all over was witnessed by girlfriend at home. The patient's last drink was 6 pack yesterday evening. He continues to drink about more than 6 pack every day. This information was provided by his girlfriend to the R.N. and  ER M.D. earlier. The patient will be admitted on telemetry floor. We will continue to watch for alcohol withdrawal seizures. We will continue CIWA protocol with IV Ativan. Continue IV mvi bag. Psychiatric consultation for chronic alcoholism.  2. Malignant/accelerated hypertension. Give clonidine  0.1 mg STAT in the Emergency Room and continue the patient's home hypertension meds. Consider adding clonidine if blood pressure continues to remain high.  3. Hypokalemia. We will repeat with p.o. K-Dur. Check magnesium and phosphorous given the patient's history of chronic alcoholism.  4. History of alcohol-induced cardiomyopathy with chronic systolic congestive heart failure with ejection fraction of 20% to 25% from echo 2014. The patient appears to be well compensated at this time, his sats are 98% on room air. No evidence of congestive heart failure at this time.  5. Gastroesophageal reflux disease. We will give the patient Zantac b.i.d.  6. Deep vein thrombosis prophylaxis. Subcutaneous heparin.  7. Further work-up according to the patient's clinical course. Hospital admission plan was discussed with the patient. The patient's  family members not present in the Emergency Room at this time.   CRITICAL TIME SPENT: 55 minutes.    ____________________________ Wylie Hail Allena Katz, MD sap:sg D: 06/02/2013 10:38:37 ET T: 06/02/2013 10:53:20 ET JOB#: [id number]  cc: Minervia Osso A. Allena Katz, MD, <Dictator> Willow Ora MD ELECTRONICALLY SIGNED 06/05/2013 10:47

## 2014-07-10 NOTE — Discharge Summary (Signed)
PATIENT NAME:  Johnathan Blake, Johnathan Blake MR#:  [id number] DATE OF BIRTH:  [birth year]  DATE OF ADMISSION:  06/02/2013 DATE OF DISCHARGE:  06/04/2013  For a detailed note, please take a look at the history and physical on admission by Dr. Enedina FinnerSona Patel.   DIAGNOSES AT DISCHARGE: Seizure likely alcohol withdrawal. Alcohol abuse. History of cardiomyopathy, likely alcoholic. Hyperlipidemia.   DIET: The patient is being discharged on a low-sodium diet, low-fat diet.   ACTIVITY: As tolerated.   FOLLOWUP: With Dr. Fulton MoleHarriett Burns in the next 1 to 2 weeks.    DISPOSITION: The patient is being discharged home with home health physical therapy, nursing and nursing aide services.   DISCHARGE MEDICATIONS: Coreg 25 milligrams twice daily, atorvastatin 80 milligrams at bedtime, Lasix 20 milligrams daily, aspirin 81 milligrams daily, thiamine 100 milligrams daily,  potassium 20 milliequivalents daily.   CONSULTANTS DURING THE HOSPITAL COURSE: Ardeen FillersUzma S. Garnetta BuddyFaheem, MD, psychiatry.   PERTINENT STUDIES DONE DURING THE HOSPITAL COURSE: Are as follows: A CT scan of the head done without contrast on admission, which showed no acute intracranial process.   HOSPITAL COURSE: This is a 59 year old male who presented to the hospital with a seizure suspected to be alcohol withdrawal.  1. Seizure. This was witnessed per the patient's girlfriend. The patient does have a history of alcohol abuse, says he drinks about 6 pack daily. He had not had his drink in about a day or so prior to coming to the hospital. The patient was admitted to the hospital, started on CIWA protocol. He received a few doses of Ativan. The patient has had no further seizure-type activity in the past 48 hours, has gone through his detox and is clinically doing well. The patient was seen by psychiatry; they offered him alcoholic detox but he is not interested in it at this point. He has been strongly advised to abstain from alcohol and is therefore being discharged home with  home health services.  2. Alcohol abuse. The patient was seen by psychiatry. They offered him alcohol detox as outpatient but he is not interested in it presently.  3. History of cardiomyopathy, likely alcoholic with ejection fraction of 25%. Clinically the patient had no evidence of CHF. He will continue his Coreg and Lasix.  4. Hyperlipidemia. The patient was maintained on his atorvastatin. He will resume that.   CODE STATUS: The patient is a FULL CODE.   DISPOSITION: He is being discharged home with home health services as mentioned.   TIME SPENT: 40 minutes   ____________________________ Rolly PancakeVivek J. Cherlynn KaiserSainani, MD [id number] D: 06/04/2013 16:12:47 ET T: 06/04/2013 23:00:38 ET JOB#: [id number]  cc: Rolly PancakeVivek J. Cherlynn KaiserSainani, MD, <Dictator> Hyman HopesHarriett P. Burns, MD Houston SirenVIVEK J SAINANI MD ELECTRONICALLY SIGNED 06/12/2013 20:26

## 2014-07-10 NOTE — Consult Note (Signed)
PATIENT NAME:  Johnathan Blake, Johnathan Blake MR#:  [id number] DATE OF BIRTH:  01/18/1956  DATE OF CONSULTATION:  06/02/2013  REQUESTING  PHYSICIAN:  Enedina Finner, MD CONSULTING PHYSICIAN:  Ardeen Fillers. Garnetta Buddy, MD   REASON FOR CONSULTATION: Alcohol-related seizure.   HISTORY OF PRESENT ILLNESS: The patient is a 59 year old African American male who presented to the ED after he had 2 alcohol related seizures. He has history of alcohol use for the past 30 years. He currently lives with his girlfriend. EMS was called by the girlfriend after she witnessed 2 seizures at home. The patient was interviewed in the Emergency Room. He appeared intoxicated, as well as passed out due to the influence of the medications. He reported that he consumed 6 pack of beer yesterday. Reported it was 4 ounces each. He stated that he was brought here by the ambulance. Admits to having history of seizures as well as shakes. However, he did not participate much in the interview.   Collateral information was obtained from his girlfriend and aunt. She reported that he drinks approximately 6 pack  per day,  but then on some days he does not drink. She stated that he has been drinking since he was 59 years old. She stated that she has never witnessed a seizure before, but this is the first time that he had seizures. She stated that she has been living with him for the past 30 years. She stated that they do not drink together. They spent time walking outside and sitting on the porch. She reported that he is currently worried about his 23 year old mother, who lives in a separate home in [place]. He supports himself on SSI. She stated that the patient does not have any thoughts to hurt himself. She is worried about his mental health and reported that he has an appointment at a mental health clinic behind [facility].. Reported that he was taking Jordan but is not taking any medications at this time.   PAST PSYCHIATRIC HISTORY: The patient has a history of alcohol  use since he was 59 years old. However, he does not have any history of withdrawal seizures. This is the first time that he has experienced withdrawal seizures. No history of suicidal ideations noted. He was evaluated by [facility] in the past and was given medications, but his case has been closed and is going to be evaluated again for possible depression and anxiety.   MEDICAL HISTORY: Past medical history of alcohol-induced cardiomyopathy with chronic systolic congestive heart failure, ejection fraction of 20% to 25%, history of hypothyroidism, alcohol withdrawal and DTs in the past. 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   SOCIAL HISTORY: The patient is currently married and is separated from his wife. He has two sons and has a good relationship with them. He does not drive and does not have any history of any pending legal charges. He supports himself on SSI.   FAMILY HISTORY: Of cardiac illness.   CURRENT MEDICATIONS: The patient takes some blood pressure medications, which are unknown.   REVIEW OF SYSTEMS: The patient appeared under the influence of the medication and was unable to participate in the review of systems at this time.   PHYSICAL EXAMINATION: VITAL SIGNS: Temperature 97.6, pulse 85, respirations 18, blood pressure 161/92.  LABORATORY DATA:  Glucose 124, BUN 12, creatinine 0.97 sodium 134, potassium 3.2, chloride 101, bicarbonate 24, anion gap 9, osmolality 269, calcium 9.1, phosphorus 3.2, magnesium 1.8. Blood alcohol less than 3. Alkaline  phosphatase 88, AST 114, ALT 84, TSH 5.21. UDS negative. WBC 7.5, RBC 4.9, hemoglobin 15.5, MCV 96, platelet count is 102.   MENTAL STATUS EXAMINATION: The patient is a moderately built male who was lying in the bed. His eyes were closed. His speech was mumbling. Mood was depressed and anxious. Affect was congruent. He did not communicate during the interview. He demonstrated poor insight and judgment regarding his use of  alcohol, but denied having any suicidal or homicidal ideations or plans. No perceptual disturbances are noted at this time.   DIAGNOSTIC IMPRESSION: AXIS I: 1. Alcohol withdrawal delirium.               2. Alcohol dependence.  AXIS II: None.  AXIS III: Please review the medical history.   TREATMENT PLAN:  1. The patient will be admitted to the medical floor for his alcohol withdrawal symptoms.  2. He will be started on IV Ativan along with IV fluids. He will be given clonidine 0.1 mg b.i.d. for his blood pressure. He will also be given p.o. potassium and his magnesium and potassium will be checked for his history of chronic alcoholism.  We will refer him to substance abuse rehabilitation program once he becomes clinically stable.   Thank you for allowing me to participate in the care of this patient.   ____________________________ Ardeen FillersUzma S. Garnetta BuddyFaheem, MD usf:sg D: 06/02/2013 12:54:17 ET T: 06/02/2013 13:42:13 ET JOB#: [id number]  cc: Ardeen FillersUzma S. Garnetta BuddyFaheem, MD, <Dictator> Rhunette CroftUZMA S Gerasimos Plotts MD ELECTRONICALLY SIGNED 06/02/2013 15:01

## 2014-07-11 NOTE — H&P (Signed)
PATIENT NAME:  Johnathan Blake, Johnathan Blake MR#:  [id number] DATE OF BIRTH:  Jul 18, 1955  DATE OF ADMISSION:  09/20/2011  REFERRING PHYSICIAN: Lowella Fairy, MD  PRIMARY CARE PHYSICIAN: Bethann Punches, MD  CHIEF COMPLAINT: Chest pain.   HISTORY OF PRESENT ILLNESS: This is a 59 year old male with long-standing alcohol abuse with multiple admissions for alcohol detox with resultant thrombocytopenia, dementia, ataxia, tremors, history of angina, hypertension, history of deep vein thrombosis, pulmonary embolism status post IVC filter and with history of DTs and alcohol withdrawal seizures who presents today with complaints of chest pain. The patient presented earlier overnight to the ED with chest pain complaints where he was found to have a troponin of 0.24, but left against medical advice and came again today for similar complaints of chest pain. The patient reports he started having the chest pain episode yesterday and again today. He reports it happens at rest. No relieving or provoking factors, nonradiating, sharp of quality, moderate now, not accompanied by palpitations diaphoresis, nausea, or shortness of breath. The patient is consistently having elevated troponins which are thought to be due to rhabdomyolysis from his alcohol abuse as his baseline troponin over the last few weeks was around 0.2.   PAST MEDICAL HISTORY:  1. Prior history of admission for alcohol intoxication and withdrawal with history of DTs with alcohol withdrawal seizures.  2. Alcohol abuse.  3. Alcohol induced thrombocytopenia.  4. Dementia.  5. Ataxia.  6. Tremors.  7. Angina.  8. Hypertension.  9. Degenerative joint disease.  10. Cholelithiasis. 11. History of gastrointestinal bleed.  12. 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 The patient is not a candidate for anticoagulation as an outpatient secondary to noncompliance and high risk for falls from alcohol intoxication.   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 14. Depression and psychotic disorder.   ALLERGIES: No known drug allergies.   MEDICATIONS:  1. Diltiazem CD 300 mg daily.  2. Aspirin 81 mg daily. 3. Folic acid 1 mg daily.  4. Isosorbide mononitrate 6 mg daily.  5. Metoprolol 50 mg twice a day. 6. Protonix 40 mg daily.  7. Thiamine 50 mg daily.   SOCIAL HISTORY: The patient is divorced and is living with his sister and mother. No tobacco. Drinks up to a 6-pack of beer per day.  FAMILY HISTORY: Significant for heart disease.   REVIEW OF SYSTEMS: CONSTITUTIONAL: Denies any fever, fatigue, or weakness. EYES: Denies any blurry vision or double vision. Has an artificial left eye. ENT: Denies tinnitus, ear pain, or hearing loss. RESPIRATORY: Denies cough, wheezing, hemoptysis, or dyspnea. CARDIOVASCULAR: Complains of chest pain. Denies any orthopnea, edema, arrhythmia, palpitations, or syncope. GASTROINTESTINAL: Denies nausea, vomiting, diarrhea, or abdominal pain. GENITOURINARY: Denies dysuria or hematuria. ENDOCRINE: Denies polyuria, polydipsia, heat or cold intolerance. HEMATOLOGY: Denies anemia. Has history of GI bleed and alcohol-induced thrombocytopenia. INTEGUMENT: Denies any acne or rash. MUSCULOSKELETAL: Denies any neck pain, shoulder pain, cramps, swelling, or gout. NEURO: Denies any numbness, dysarthria, headache, or migraine. PSYCH: History of alcohol abuse. Denies any insomnia, depression, or nervousness.   PHYSICAL EXAMINATION:   VITAL SIGNS: Temperature 98.1, pulse 92, respiratory rate 18, blood pressure 185/99, and saturating 99% on room air.   GENERAL: Well-nourished male who looks comfortable and in no apparent distress. Appears to be intoxicated.   HEENT: Head atraumatic, normocephalic. Right pupil is reactive to light. Pink conjunctivae. Anicteric sclera. Left artificial eye. Moist oral mucosa.   NECK: Supple. No thyromegaly. No JVD.  CHEST: Good air entry bilaterally. No wheezing, rales, or  rhonchi.   CARDIOVASCULAR: S1 and S2 heard. No rubs, murmurs, or gallops.   ABDOMEN: Obese, soft, nontender, and nondistended. Bowel sounds present.   EXTREMITIES: No edema. No clubbing. No cyanosis.   PSYCHIATRIC: The patient is intoxicated, communicative, responds appropriately to questions with mild decrease in cognition. Awake and alert x2.  NEUROLOGICAL: Cranial nerves grossly intact. Motor 5/5.  SKIN: No rash.  PERTINENT LABORATORY AND DIAGNOSTICS: Glucose 98, BUN 11, creatinine 0.98, sodium 140, potassium 3.6, chloride 104, CO2 27, protein 9.1, albumin 3.9, AST 51, ALT 28, and alkaline phosphatase 115. Total CK 401, CK-MB 4.6, and troponin 0.24 at 4:00 p.m. and currently at 0.29.  Urine drug abuse negative.  White blood cells 7.7, hemoglobin 14.7, hematocrit 44.8, and platelets 284.   EKG is showing normal sinus rhythm with left bundle branch block which is old, ventricular rate 89.   ASSESSMENT AND PLAN:  1. Chest pain with elevated troponin. The patient's troponin is gradually increasing, has complaints of chest pain and known history of coronary artery disease on management with beta blocker, Imdur, and aspirin. The patient is already given 324 mg of aspirin. Discussed with Dr. Juliann Paresallwood. We will start him on a heparin drip for anticoagulation and possible need for cardiac catheterization in the a.m. depending on the troponin trend.  2. Alcohol abuse. We will start the patient on CIWA protocol. The patient was counseled. We will continue with thiamine and folic acid.  3. History of deep venous thrombosis and pulmonary embolus. The patient is status post IVC filter. The patient is not a candidate for anticoagulation due to noncompliance and high risk for fall from frequent alcohol intoxication episodes and history of GI bleed.  4. Hypertension, uncontrolled. We will resume diltiazem and Imdur and metoprolol.   CODE STATUS: THE PATIENT IS FULL CODE.   TOTAL TIME SPENT ON PATIENT  CARE: 50 minutes  ____________________________ Starleen Armsawood S. Hillman Attig, MD dse:slb D: 09/20/2011 08:59:31 ET T: 09/21/2011 07:30:53 ET JOB#: [id number]  cc: Starleen Armsawood S. Kao Berkheimer, MD, <Dictator> Danella PentonMark F. Miller, MD Leslea Vowles Teena IraniS Marieliz Strang MD ELECTRONICALLY SIGNED 09/29/2011 0:17

## 2014-07-11 NOTE — Consult Note (Signed)
Delerium, multifactorial, resolvingDependence with phys dependence declines alcohol rehab and is not commitable finish Ativan withdrawal protocol Psychotic D/O NOS  note to follow.   Electronic Signatures for Addendum Section: Lester CarolinaWilliford, Elva Breaker S (MD)  (Signed Addendum 16-Apr-13 18:59) Doreatha MartinFinish Ativan withdrawal protocol.   Electronic Signatures: Tzirel Leonor, Adelene AmasJames S (MD) (Signed on 16-Apr-13 15:22)  Authored   Last Updated: 16-Apr-13 19:01 by Lester CarolinaWilliford, Diquan Kassis S (MD)

## 2014-07-11 NOTE — Consult Note (Signed)
PATIENT NAME:  Johnathan Blake, Kanoa L MR#:  [id number] DATE OF BIRTH:  May 04, 1955  DATE OF CONSULTATION:  09/20/2011  REFERRING PHYSICIAN:  Bethann PunchesMark Miller, MD CONSULTING PHYSICIAN:  Dwayne D. Callwood, MD  INDICATION: Possible non-Q-wave myocardial infarction, alcohol abuse, angina with chest pain.   HISTORY OF PRESENT ILLNESS:  The patient is a 59 year old African American male with a history of multiple admissions for a longstanding alcohol abuse, thrombocytopenia, dementia,  ataxia, tremors, history of angina, hypertension, deep vein thrombosis, pulmonary embolus, IVC filter, DTs from withdrawal, and seizures who presented with complaint of chest pain, weakness, and fatigue. He has been persistently drinking but recently presented with chest pain and had elevated troponin so he was admitted for further evaluation and care. This is his second admission within the last month or so. Further evaluation is recommended including possible cardiac catheterization.   REVIEW OF SYSTEMS: No blackout spells or syncope. No nausea or vomiting. No fever. No chills. No sweats. No weight loss. No weight gain. No hemoptysis or hematemesis. No bright red blood per rectum.   PAST MEDICAL HISTORY:  1. Alcohol abuse, DTs, seizures. 2. Alcohol-induced thrombocytopenia.  3. Dementia.  4. Ataxia.  5. Tremors.  6. Angina.  7. Hypertension.  8. Degenerative joint disease.  9. Cholelithiasis.  10. Reflux.  11. Deep vein thrombosis. 12. Artificial eye on the left. 13. Depression. 14. 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   ALLERGIES: None.   MEDICATIONS:  1. Diltiazem CD 300 a day. 2. Aspirin 81 mg a day. 3. Folic acid once a day. 4. Imdur 60 mg a day.  5. Metoprolol 50 twice a day.  6. Protonix 40 a day.  7. Thiamine 50 a day.   FAMILY HISTORY: Cardiac disease.   SOCIAL HISTORY: Divorced. Lives with sister and mother. Drinks 40-ounce malt liquor every  day.   REVIEW OF SYSTEMS: No blackout spells. No syncope. No nausea,  no vomiting. No fever. No chills, no sweats. No weight loss, no weight gain, no hemoptysis, no hematemesis. No bright red blood per rectum. No vision change. No hearing change.   PHYSICAL EXAMINATION:  VITAL SIGNS: Blood pressure 190/100,  pulse 92, respiratory rate 18, afebrile.   HEENT: Normocephalic, atraumatic. Pupils equal and reactive to light. He is blind in the left eye.   NECK: Supple. No jugular venous distention, bruits, or adenopathy.    HEART: Regular rate and rhythm. Systolic ejection murmur left sternal border. PMI is nondisplaced.   ABDOMEN: Benign.   EXTREMITIES: Within normal limits.  NEUROLOGIC: Intact.   SKIN: Normal.   LABORATORY DATA:  Glucose 98, BUN 11, creatinine 0.98, sodium 140, potassium 3.6, chloride 104, CO2 27, AST 51, ALT 28, alkaline phosphatase 115, troponin 0.24. Urinalysis negative. White count 7.4, hemoglobin of 14.7, hematocrit 44.8, platelet count 284. EKG: Normal sinus rhythm, left bundle branch block, rate of 90, nonspecific ST-T wave changes.   ASSESSMENT:  1. Angina, chest pain, possible non-Q-wave myocardial infarction. 2. Alcohol abuse. 3. Withdrawal seizures.  4. Hypertension. 5. History of deep venous thrombosis.  6. Thrombocytopenia.   PLAN:  1. Chest pain related to alcohol abuse. Agree with admit. Continue telemetry.  2. Continue weight loss. Continue to follow up EKG. Follow up troponins. Consider echocardiogram. We will proceed with cardiac catheterization to definitively rule out coronary artery disease with his persistent chest pain. This may represent reflux. Advised the patient to refrain from alcohol consumption at least.  3. Deep venous thrombosis.  He is not a good anticoagulation candidate because of  falls and noncompliance and IVC filter is in place.  4. Hypertension, controlled.  5. Continue CIWA protocol for his alcohol abuse and withdrawal seizures. We will treat the patient medically for now but will base further  evaluation on the cath.   ____________________________ Dwayne D. Juliann Pares, MD ddc:bjt D: 09/24/2011 13:31:15 ET T: 09/24/2011 15:52:50 ET JOB#: [id number]  cc: Dwayne D. Juliann Pares, MD, <Dictator> Alwyn Pea MD ELECTRONICALLY SIGNED 10/09/2011 9:13
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
## 2014-07-11 NOTE — Consult Note (Signed)
PATIENT NAME:  Johnathan Blake, WORTH MR#:  [id number] DATE OF BIRTH:  November 16, 1955  DATE OF CONSULTATION:  07/03/2011  REFERRING PHYSICIAN:  Alounthith Phichith, MD CONSULTING PHYSICIAN:  Adelene Amas. Galileah Piggee, MD  REASON FOR CONSULTATION: Mental status changes.   HISTORY OF PRESENT ILLNESS: Johnathan Blake is a 59 year old male admitted to [facility] on 07/03/2011, brought in by Police with involuntary commitment. The patient's brother called the police due to the patient displaying erratic behavior, hallucinations and agitation.   The undersigned reviewed the record and talked to the patient's girlfriend. The girlfriend was not given any information, however, she provided a history.  She stated that Johnathan Blake was drinking regularly for weeks a 12-pack of beer per day. He stopped three days prior to admission. She also stated that he had stopped all of his medications an unknown time before hospitalization, possibly weeks. By the second day prior to admission, Mr. Orlick was beginning to have tremors and was asking for a beer. He then progressed to having the above symptoms on admission.   At the time of the undersigned's visit, the patient does have partial short-term recall difficulty. His thought process is coherent. He has no hallucinations, delusions, thoughts of harming himself or thoughts of harming others. He denies any depressed mood. He smiles appropriately as his girlfriend stands at the bed. He does acknowledge having visual and auditory hallucinations in the past week but has none now. His girlfriend states that he recognized her immediately upon her arrival today.   PAST PSYCHIATRIC HISTORY: Johnathan Blake did have a psychiatric hospitalization approximately 10 years ago. The nature of symptoms at that time is not known, however, the girlfriend states that he was having hallucinations at that time. He has no history of suicide attempts. He has no history of major depression or mania. He was seen  by a psychiatrist in receiving outpatient medication five years ago; however, the nature of symptoms or the medication is not known.   Johnathan Blake does have a history of DTs with alcohol withdrawal seizures. He does have a history of psychiatric admission as above. He also has had alcohol-induced thrombocytopenia. There are other diagnoses mentioned in the record such as dementia as well as depression and psychotic disorder. It is unclear at this point whether mood and psychotic features were secondary to an alcohol withdrawal syndrome and alcohol withdrawal delirium.   Reviewing the past medical record going back to April 2002, the patient was continuing his drinking at that time up to four 24-ounce Budweiser beers per day and noted to have some difficulty with finger-to-nose testing. He had been admitted for alcohol intoxication in 2008. He was noted to have loxapine capsules 25 mg daily and was continuing to drink on a daily basis. His documentation during a December 2008 admission included diagnoses of alcohol-induced thrombocytopenia, alcohol-induced dementia. At that time he had been placed on an Ativan taper. There were no antipsychotics mentioned. Difficulty with orientation to time and place was noted. On the Discharge Summary for December of 2008, Namenda was noted. Also, he had been placed on Geodon 40 mg b.i.d. and loxapine 25 mg b.i.d.   FAMILY PSYCHIATRIC HISTORY: None known.   SOCIAL HISTORY: No history of illegal drugs. Mr. Goodpasture is divorced. The patient does have two grown children. He resides with his sister and is on SSI for general medical problems. He has a drinking history as above.   PAST MEDICAL/SURGICAL HISTORY:  1. Ataxia. 2. Tremor. 3. Hypertension. 4.  Angina. 5. Cholelithiasis. 6. Degenerative joint disease. 7. History of gastrointestinal bleed. 8. Right popliteal deep venous thrombosis. 9. Bilateral pulmonary embolus status post inferior vena cava  filter.  10. History of  a left eye avulsion and is status post left artificial eye implant.   ALLERGIES: No known drug allergies.    LABORATORY, DIAGNOSTIC AND RADIOLOGICAL DATA:  BUN 25, creatinine 0.87. Folate normal.  Free thyroxine normal. Ammonia normal.  CT of the head without contrast: Changes of atrophy with chronic small vessel ischemic disease. No acute intracranial abnormality.  EKG: QTc 534. Urinalysis unremarkable.  Urine drug screen unremarkable.  Aspirin negative.  On 07/02/2011: WBC 6.7, hemoglobin 13.7, platelet count 115.   REVIEW OF SYSTEMS: Constitutional, head, eyes, ears, nose, throat and mouth, neurologic, psychiatric, cardiovascular, respiratory, gastrointestinal, genitourinary, skin, musculoskeletal, hematologic, lymphatic, endocrine, metabolic, all are unremarkable.   PHYSICAL EXAMINATION:  VITAL SIGNS: Temperature 97.9, pulse 86, respiratory rate 20, blood pressure 153/84, 02 oxygen saturation on room air 97%.   GENERAL APPEARANCE: Johnathan Blake is a middle-aged male lying in a supine position in his hospital bed with no abnormal involuntary movements. He has no cachexia. His grooming is mildly disheveled. His hygiene is normal.   MENTAL STATUS EXAM: He has mild decreased concentration. He becomes alert when he is woken up and maintains good eye contact. On orientation testing, he knows the year, month, as well as place. He does not know the day of the week, the date. He is oriented to person. On memory testing, he names three out of three visual objects immediately and one out of three at five minutes. His concentration is moderately decreased. His use of language, intelligence, and fund of knowledge are mildly below his estimated premorbid baseline. His speech is soft. There is no dysarthria. Thought process is logical, coherent, and goal directed without looseness of associations or tangents. However, he has poverty of speech. Thought content: No thoughts of harming himself or others. No  delusions or hallucinations. Mood within normal limits. Affect mildly flat. Insight intact. Judgment intact.   ASSESSMENT:  AXIS I:  1. Delirium, not otherwise specified. The main factor does appear to be alcohol withdrawal, however, other factors appear to be general medical contributions.  2. Alcohol dependence with physiologic dependence.  3. Psychotic disorder, not otherwise specified. The patient's history is presenting a number of potential confounding factors. For example, his thiamine level in his diet could vary, which could intermittently compromise his memory function. He could go in and out of alcohol withdrawal delirium. There is also a possibility that he has a functional psychotic disorder;  however, his age would make the onset of a functional psychotic disorder unlikely. His drinking history, possible thiamine deficiency also make diagnosing functional depression at any point difficult.  AXIS II: Deferred.   AXIS III:  1. History of hypertension. 2. Deep vein thrombosis with emboli. 3. History of angina. Please see the past medical history.   AXIS IV: General medical, primary support group.   AXIS V: 50.   Johnathan Blake is not at risk to harm himself or others. He agrees to call Emergency Services immediately for any thoughts of harming himself, thoughts of harming others, or distress.   Johnathan Blake lacks the capacity to live alone at this time; however, his capacity to live alone could become consistently intact if he displays consistent intact short-term recall and improved energy.   He does have the capacity to refuse alcohol rehabilitation. He can make a consistent choice.  He can differentiate between the risks versus benefits of an alcohol rehab program, and he can appreciate the benefits of alcohol abstinence for himself. He also can reason well enough to consider these issues. However, he declines any form of alcohol rehabilitation, and this is consistent with his past history  of not seeking adequate help. He does agree to contact [facility] if he changes his mind about alcohol rehabilitation.   He consents to have his girlfriend in the room. She concurs with him achieving abstinence but also understands that it has to be his insight and motivation.   RECOMMENDATIONS:  1. I would ensure that thiamine 50 to 100 mg a day is part of his regular regimen.  2. Regarding antipsychotic treatment, please see the above discussion. If no hallucinations are present outside the window of alcohol withdrawal, I would not prescribe an antipsychotic. Currently, he presents no criteria to prescribe an antipsychotic. Also, his QTc on EKG is above 550, which makes prescribing an antipsychotic contraindicated.  3. The same rationale goes for antidepression treatment. Currently, he is not demonstrating any depression. Please see the above discussion. If depressive symptoms present, he can be started on ann antidepressant.  4. I would provide the patient an outpatient psychiatric follow-up appointment. The Triage nurse in the Behavioral Medicine Emergency Department can be contacted to help arrange outpatient psychiatric follow-up with or without substance abuse follow-up. This will allow the patient's mood symptoms to be monitored. It will also allow the patient a point of contact to request a substance rehabilitation program if he becomes motivated.  ____________________________ Adelene Amas. Ysabela Keisler, MD jsw:cbb D: 07/03/2011 18:47:28 ET T: 07/03/2011 19:03:39 ET JOB#: [id number]  cc: Adelene Amas. Sencere Symonette, MD, <Dictator> Lester [place] MD ELECTRONICALLY SIGNED 07/08/2011 13:05

## 2014-07-11 NOTE — Consult Note (Signed)
Brief Consult Note: Diagnosis: Alcohol dependence, alcohol withdrawal delirium.   Patient was seen by consultant.   Consult note dictated.   Recommend further assessment or treatment.   Comments: [name] is an alcoholic. [name] He is feeling much better and agrees to come to psychiatry to complete alcohol detox.  PLAN: 1. Please continue CIWA protocol.  2. Please transfer to [facility] when medically stable. Call admission nurse at [phone] to coordinate transfer.  Electronic Signatures: Kristine LineaPucilowska, Tremar Wickens (MD)  (Signed 12-Jun-13 11:13)  Authored: Brief Consult Note   Last Updated: 12-Jun-13 11:13 by Kristine LineaPucilowska, Ray Glacken (MD)
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
## 2014-07-11 NOTE — H&P (Signed)
PATIENT NAME:  Johnathan Blake, Johnathan Blake MR#:  [id number] DATE OF BIRTH:  [birth year]  DATE OF ADMISSION:  07/03/2011  REFERRING PHYSICIAN: Dr. Clemens Catholic.  PRIMARY CARE PHYSICIAN: [facility].   PRESENTING COMPLAINT: Was brought in by police with involuntary commitment. Per ED notes, brother had called the police with the patient having erratic behavior, agitation, hallucinations and altered from baseline.   HISTORY OF PRESENT ILLNESS: Mr. Moyano is a 59 year old gentleman with unfortunate history of long-standing alcohol abuse with resultant alcohol-induced thrombocytopenia, dementia, ataxia, tremor, history of angina, hypertension, history of deep venous thrombosis, pulmonary embolus status post inferior vena cava filter, prior history of DTs and alcohol withdrawal seizures who presents within the above situation. There are no family members in the room. The patient knows that he is at [facility] and knows that he is brought in by the police and reports that his reason for coming in was that he "drinks too much". Difficult understanding the patient with dysarthria as well as nonsensical speech.   PAST MEDICAL HISTORY:  1. Prior history of admission for alcohol intoxication as well as withdrawals with history of DTs and alcohol withdrawal seizures.  2. Alcohol abuse.  3. Alcohol-induced thrombocytopenia. 4. Dementia. 5. Ataxia.  6. Tremor.  7. Angina.  8. Hypertension.  9. Degenerative joint disease.  10. Cholelithiasis.  11. History of gastrointestinal bleed.  12. 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  14. Depression and psychotic disorder.   PAST SURGICAL HISTORY: As above.   ALLERGIES: No known drug allergies.   MEDICATIONS: Unknown. Not able to verify the patient's taking the same medications compared to his last discharge in December 2008.   SOCIAL HISTORY: From previous chart:  He is divorced. He is currently living with his sister and mother. No tobacco. Drinks maybe a 6 pack of beer per day.   FAMILY HISTORY: Heart disease.   REVIEW OF SYSTEMS: Unreliable. The patient is confused but denies any pain. No nausea or vomiting.   PHYSICAL EXAMINATION:  VITAL SIGNS: Temperature 98.2, pulse 85, respiratory rate 20, blood pressure 119/69, sating at 95% on room air.   GENERAL: Lying in bed, in and out of sleep.   HEENT: Normocephalic. His left eye has some exudate with an artificial eye in place. He has large swollen lips. No erythema. He has moist mucous membranes. He apparently presented with a similar picture in the ED.   CARDIOVASCULAR: Non-tachy. No murmurs, rubs, or gallops.   LUNGS: Anteriorly with some faint basilar crackles. No use of accessory muscles or increased respiratory effort.   ABDOMEN: Soft. Positive bowel sounds. No mass appreciated.   EXTREMITIES: No edema. Dorsal pedis pulses intact.   MUSCULOSKELETAL: No joint effusion.   SKIN: No ulcers.   NEUROLOGIC: The patient not cooperating with neurological exam.   PSYCH: He is confused, but does not appear agitated at this time.   LABORATORY, RADIOLOGICAL AND DIAGNOSTIC DATA: Ammonia level 25, blood osmolality 290, CK 2,006, MB 9.3. Troponin of 0.15. CT of head without contrast shows changes of atrophy with chronic small vessel ischemic disease. Mucosal thickening in the maxillary sinus bilaterally. There is a prosthetic lobe in the left orbit. Acetaminophen level less than 2. Glucose 84, BUN 41, creatinine 2.26, sodium 125, potassium 3.2, chloride 88, carbon dioxide 18, calcium 8.9, total bilirubin 1.1, alkaline phosphatase 66, ALT 61, AST 120, total protein 9.2, albumin 4.1. Ethanol level 105. WBC 6.7, hemoglobin  13.7, hematocrit 39.8, platelet 115, MCV 93, salicylate level less than 1.7. TSH 9.24. Urine drug screen negative. Urinalysis with specific gravity of 1.006, pH 5, two per high-power field  RBC, less than one per high-power field of WBC. EKG with sinus rate of 81. There is left bundle branch block and minimal Q waves in I, II, aVL. No ST elevation or depression.   ASSESSMENT AND PLAN: [name] is a 59 year old gentleman with long-standing alcohol abuse and sequelae of alcohol abuse, deep vein thrombosis, pulmonary emboli status post inferior vena cava filter, gastrointestinal bleed, hypertension, angina, brought in by police with involuntary commitment orders from the magistrate.  1. Altered mental status, likely multifactorial, with probable alcohol withdrawal plus/minus hyponatremia, plus/minus mild uremia and acidosis. TSH level was elevated. Will send free T3 and T4. Ammonia level is within normal limits. Send folate, B12, RPR. Will initiate on CIWA protocol. Continue neuro checks. Follow electrolytes and seizure precautions. Hold all medications for now, is able to verify and the patient is not completely alert. Will establish a sitter and obtain psychiatric consultation.  2. Hyponatremia and hypokalemia, likely beer potomania plus/minus psych medications. TSH as above. Send uric acid level. Will obtain a mag level. Follow BMP closely and continue IV fluids for now.  3. History of deep venous thrombosis and pulmonary embolus status post inferior vena cava filter, currently not on any anticoagulation as far as I know. Will send a PT/INR. Start on heparin subcutaneous for now given his high risk for developing deep vein thrombosis/PE. Will continue to monitor his platelets and hemoglobin closely given the thrombocytopenia and history of anemia and gastrointestinal bleed.    TIME SPENT: Approximately 50 minutes were spent on patient care.   ____________________________ Reuel DerbyAlounthith Dia Jefferys, MD ap:ap D: 07/03/2011 05:05:21 ET T: 07/03/2011 08:42:13 ET JOB#: [id number]  cc: Pearlean BrownieAlounthith Mart Colpitts, MD, <Dictator> [facility] Daleah Coulson MD ELECTRONICALLY SIGNED 07/15/2011 2:08
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
## 2014-07-11 NOTE — Consult Note (Signed)
PATIENT NAME:  Johnathan Blake, Alize L MR#:  [id number] DATE OF BIRTH:  01/20/1956  DATE OF CONSULTATION:  07/04/2011  REFERRING PHYSICIAN:  Alounthith Phichith, MD  CONSULTING PHYSICIAN:  Dwayne D. Callwood, MD  INDICATION: Possible non-Q-wave myocardial infarction, altered mental status.   HISTORY OF PRESENT ILLNESS: [name] is a 59 year old African American male with longstanding history of alcohol abuse, alcohol-induced thrombocytopenia, dementia, ataxia, tremors, angina, hypertension, history of DVT in the past, pulmonary embolus status post IVC filter who has had withdrawal seizures, reportedly started drinking again, usually drinks about a sixpack a day and had recurrent persistent symptoms which got progressively worse. The patient was brought in by the police for further evaluation saying he drank too much and now slightly confused. Occasional chest pain.  REVIEW OF SYSTEMS: Denies blackout spells or syncope. Denies nausea or vomiting. No fever. No chills. No sweats. No weight loss. No weight gain. No hemoptysis or hematemesis. No bright red blood per rectum.   PAST MEDICAL HISTORY:  1. Withdrawal seizures.  2. Altered mental status.   3. Alcohol abuse. 4. Alcohol-induced thrombocytopenia.  5. Dementia. 6. Ataxia. 7. Tremors. 8. Angina.  9. Hypertension.  10. Degenerative joint disease.  11. Cholelithiasis.  12. GI bleeding.  13. Obesity.  14. Right popliteal DVT, status post inferior vena cava filter. 15. Left artificial eye.  16. Depression. 17. Psychosis.   PAST SURGICAL HISTORY: IVC filter.   ALLERGIES: None.   MEDICATIONS: The patient has been noncompliant with all of his medicines.   SOCIAL HISTORY: Divorced, living with his sister and mother. Denies smoking. Drinks a sixpack of beer a day.   FAMILY HISTORY: Heart disease.   PHYSICAL EXAMINATION:   VITAL SIGNS: Blood pressure 120/70, pulse 80, respiratory rate 16, afebrile.   HEENT: Normocephalic, atraumatic. Pupils  equal and reactive to light. He has a prosthesis in his left eye with what looks like conjunctivitis.   NECK: Supple. No JVD, bruits, or adenopathy.   LUNGS: Clear to auscultation and percussion. No significant wheeze, rhonchi, or rales.   HEART: Regular rate and rhythm. He has a systolic murmur in the left sternal border. Positive S4. PMI nondisplaced.   ABDOMEN: Benign.   EXTREMITIES: Within normal limits.   NEUROLOGIC: Intact.   SKIN: Normal.   PSYCHOLOGIC: He is slightly delusional, lethargic, arousable.   LABORATORY, DIAGNOSTIC, AND RADIOLOGICAL DATA: Ammonia 25. Blood osmolality 290. CK 2000. MB 9. Troponin 0.15.   CT of the head with atrophy, small vessel disease. 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   Tylenol level less than 2. Glucose 84, BUN 41, creatinine 2.26, sodium 125, potassium 3.2, ALT 61, AST 120. Alcohol level 105. White count 6.7, hemoglobin 13.7, hematocrit 39.8, platelet count 115. TSH 9.24. Urinalysis basically unremarkable.   EKG normal sinus rhythm, left bundle branch block, Q waves in 1, 2, nonspecific findings.   ASSESSMENT:  1. Altered mental status. 2. History of coronary artery disease. 3. History of angina.  4. Hypertension.  5. Hyponatremia.  6. History of DVT.  7. Alcohol abuse.  8. Noncompliance. 9. Withdrawal seizures.  10. Gastroesophageal reflux disease.   PLAN:  1. Agree with admission and treatment.  2. Follow-up troponins. 3. Keep on telemetry.  4. Anticoagulation. 5. Put the patient CIWA protocol.  6. Watch for withdrawal seizures.  7. Treat for altered mental status. 8. Control and replace his thyroid function. His TSH is high.  9. Recommend echocardiogram.  10. Will probably hold off on a functional study or  cath for now until we are sure he is not going to go into DT's.  11. Will try to treat the patient aggressively medically.  12. Lipid management and evaluation will be helpful. 13. Try to advise the patient to quit  alcohol abuse and try to advise him to get counseling. I am concerned that unless he quits alcohol abuse then I think he will continue to do poorly.  14. I do not recommend any direct cardiac studies at this point and will continue to follow until he recovers. 15. Renal function should be followed as his creatinine is elevated and he may need hydration and possibly bicarb to help.  16. Will continue to treat the patient medically in the interim.  ____________________________ Bobbie Stack Juliann Pares, MD ddc:drc D: 07/04/2011 10:54:49 ET T: 07/04/2011 11:30:48 ET JOB#: [id number]  cc: Dwayne D. Juliann Pares, MD, <Dictator> Alwyn Pea MD ELECTRONICALLY SIGNED 07/20/2011 4:45

## 2014-07-11 NOTE — Discharge Summary (Signed)
PATIENT NAME:  Johnathan Blake, Johnathan Blake MR#:  [id number] DATE OF BIRTH:  1955/10/06  DATE OF ADMISSION:  07/03/2011 DATE OF DISCHARGE:  07/04/2011  For a detailed note, please take a look at the history and physical done on admission by Dr. Pearlean BrownieAlounthith Phichith.  DISCHARGE DIAGNOSES: 1. Altered mental status secondary to alcohol abuse and withdrawal, much improved. 2. Acute renal failure, resolved. 3. Hypokalemia, improved with supplementation. 4. Rhabdomyolysis, improved.  5. Left eye conjunctivitis.   DIET: The patient is being discharged on a low-sodium diet.   ACTIVITY: As tolerated.   DISCHARGE INSTRUCTIONS/FOLLOWUP: The patient is being discharged with home health physical therapy nursing and social work services. [facility] the next 1 to 2 weeks.   DISCHARGE MEDICATIONS:  1. Metoprolol tartrate 100 mg twice a day. 2. Imdur 60 mg daily.  3. Cardizem CD 300 mg daily. 4. Thiamine 50 mg daily. 5. Folic acid 1 mg daily. 6. Ofloxacin 0.3% eye drops 1 to 2 drops to the left eye every four hours x2 days.   CONSULTANTS: 1. Antonietta BreachJames Williford, MD - Psychiatry. 2. Dorothyann Pengwayne Callwood, MD - Cardiology.   PERTINENT STUDIES: CT scan of the head done without contrast admission showed chronic atrophy with small vessel ischemic disease.   Chest x-ray done on admission showed no acute cardiopulmonary disease of the chest.   Echocardiogram showed left ventricular function to be grossly normal, ejection fraction 55%, and left ventricular wall thickness to be normal.   REASON FOR ADMISSION: This is a 59 year old male with medical problems as mentioned above who presented to the hospital on 07/03/2011 due to agitation, erratic behavior, and hallucinations.  1. Altered mental status. The patient's altered mental status was likely multifactorial in nature, but likely related to alcohol withdrawal along with hyponatremia and some mild metabolic acidosis with renal  failure. The patient was hydrated with IV fluids, started on thiamine and folate, and also CIWA protocol for his alcohol withdrawal. Over the next 24 hours, the patient's clinical symptoms dramatically improved. He was more alert, awake, and oriented. His renal function improved. The patient was seen in consultation by psychiatry, Dr. Jeanie SewerWilliford. He offered the patient services and referral for outpatient detox, although he was not interested. He was therefore arranged for home health nursing, physical therapy, and social work services upon discharge. His mental status was back to baseline upon discharge.  2. Acute renal failure. This was likely secondary to dehydration and poor p.o. intake. It improved with IV fluids. Creatinine was back to baseline upon discharge.  3. Hypokalemia. This was also secondary to dehydration and poor p.o. intake. It had improved with supplementation.  4. Elevated cardiac markers with CKs. The patient had a troponin which was steady at 0.15. He was seen in consultation by cardiology who did not think that the patient's troponin elevation was related to acute coronary syndrome, but likely related to underlying dehydration and alcoholic liver disease. The patient did have an echocardiogram done which showed normal LV function. After IV fluids, the patient's transaminases and CKs had come down from 2000 to the low 900 range. He did not have any muscle aches and his renal function had improved.  5. Hypertension. When the patient presented to the hospital he was more on the hypotensive side; therefore, his antihypertensives were held, although he may resume his home medications including metoprolol, Imdur, and Cardizem upon discharge with close follow-up with his primary care physician as an outpatient.  6. Left  eye conjunctivitis. The patient has a glass left eye, but he had a yellowish discharge from the eye. He was started on ofloxacin eyedrops. He will continue that for the next two  days. If this continues to be a problem, the patient likely will need a referral to an ophthalmologist as an outpatient.  7. Alcohol withdrawal. The patient was maintained on CIWA protocol and did not have any evidence of acute seizures. He was strongly advised to abstain from alcohol. He was discharged on thiamine and folate as stated.   CODE STATUS: FULL CODE.  TIME SPENT: 40 minutes. ____________________________ Johnathan Blake. Cherlynn Kaiser, MD vjs:slb D: 07/05/2011 16:47:54 ET T: 07/06/2011 12:52:10 ET JOB#: [id number]  cc: Johnathan Blake. Cherlynn Kaiser, MD, <Dictator> [facility] Siren MD ELECTRONICALLY SIGNED 07/10/2011 12:19

## 2014-07-11 NOTE — H&P (Signed)
PATIENT NAME:  Johnathan Blake, Johnathan Blake MR#:  [id number] DATE OF BIRTH:  Jan 27, 1956  DATE OF ADMISSION:  07/03/2011  ADDENDUM:  ASSESSMENT AND PLAN:  1. Acute renal failure with dehydration and prerenal status. Urinalysis unrevealing. Continue IV fluids. Further workup if no improvement with hydration. Continue I's and O's.  2. Elevated cardiac enzymes with rhabdomyolysis and alcohol as above. Will continue on tele. Cycle cardiac enzymes. Obtain an echocardiogram. Obtain Cardiology consultation.   ____________________________ Reuel DerbyAlounthith Bence Trapp, MD ap:drc D: 07/03/2011 05:16:15 ET T: 07/03/2011 08:48:43 ET JOB#: [id number]  cc: Pearlean BrownieAlounthith Ayriel Texidor, MD, <Dictator> Reuel DerbyALOUNTHITH Brandalyn Harting MD ELECTRONICALLY SIGNED 07/15/2011 2:07

## 2014-11-15 IMAGING — CT CT HEAD WITHOUT CONTRAST
1 of 2 series · 15 of 30 positions shown, 19 images · non-contrast
Comparison: none

REASON FOR EXAM: fall, R facial injury. h/o coumadin use
COMMENTS:

PROCEDURE:     CT  - CT HEAD WITHOUT CONTRAST  - November 30, 2012  [DATE]
RESULT:     Comparison:  None
TECHNIQUE: Multiple axial images from the foramen magnum to the vertex were
obtained without IV contrast.

[Series 3: facial 3.0 h60f · axial · 0.34mm/px · z∈[+636,+782]mm · 15 of 57 slices shown, 19 images]
[im 4/57  brain]
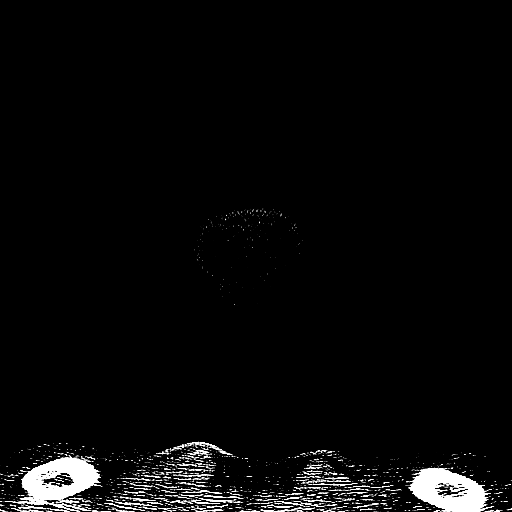
[im 4/57  bone]
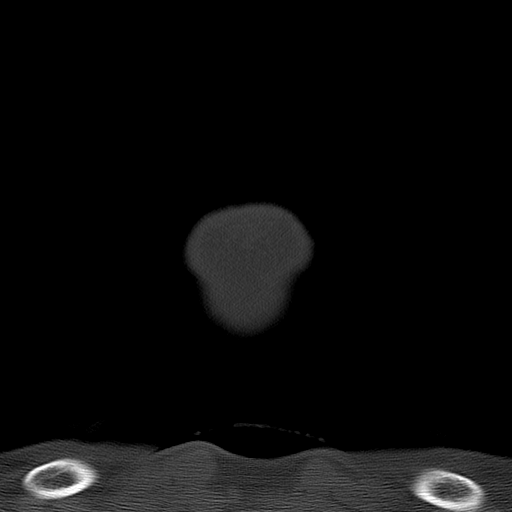
[im 7/57  brain]
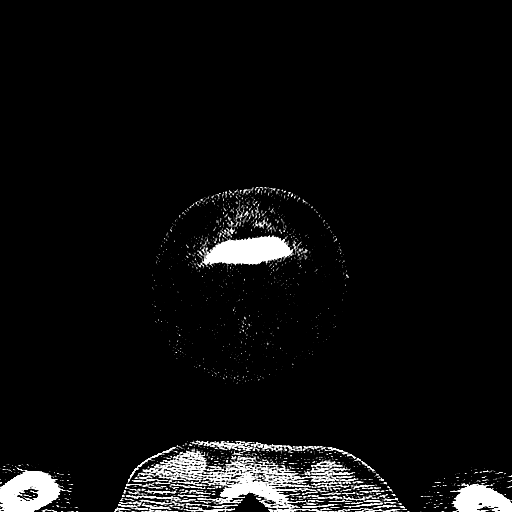
[im 10/57  brain]
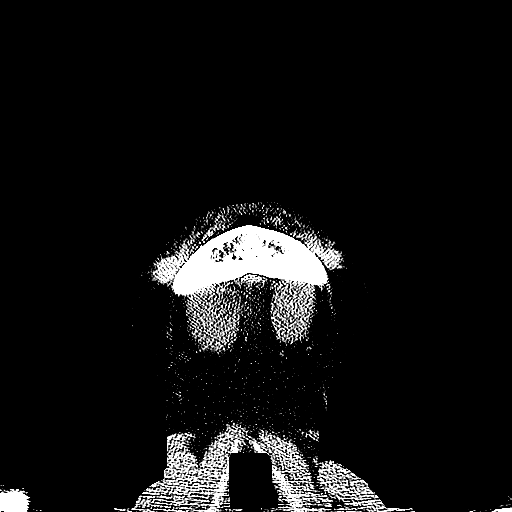
[im 13/57  brain]
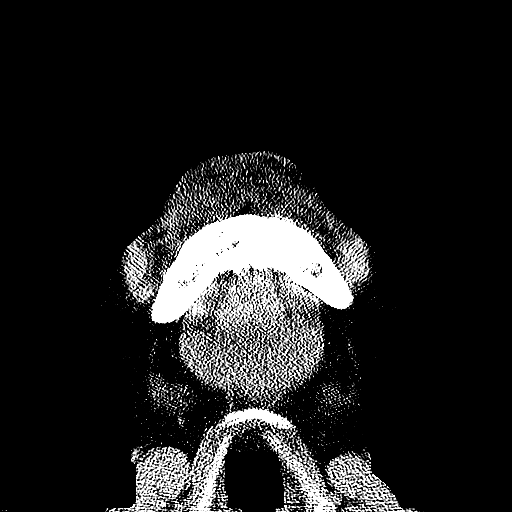
[im 19/57  brain]
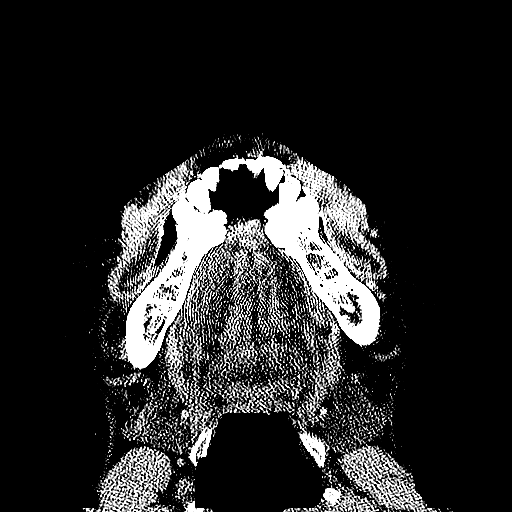
[im 19/57  bone]
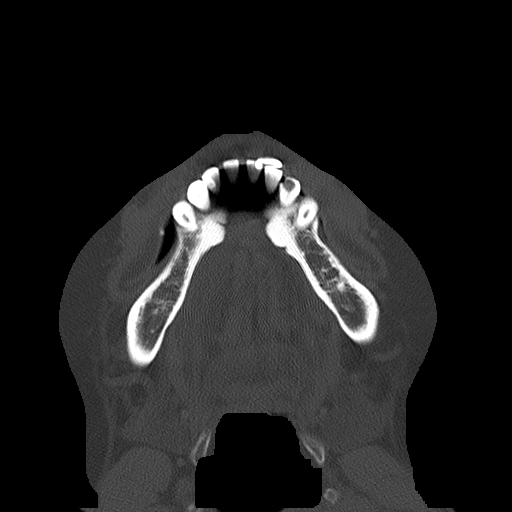
[im 22/57  brain]
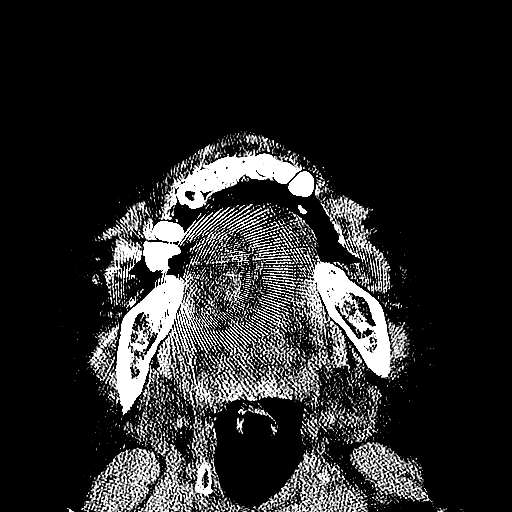
[im 25/57  brain]
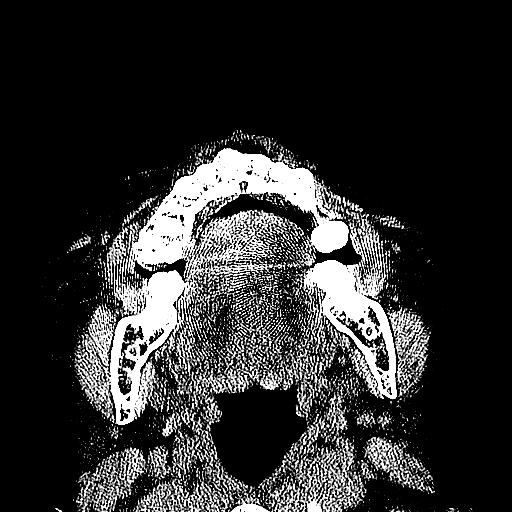
[im 29/57  brain]
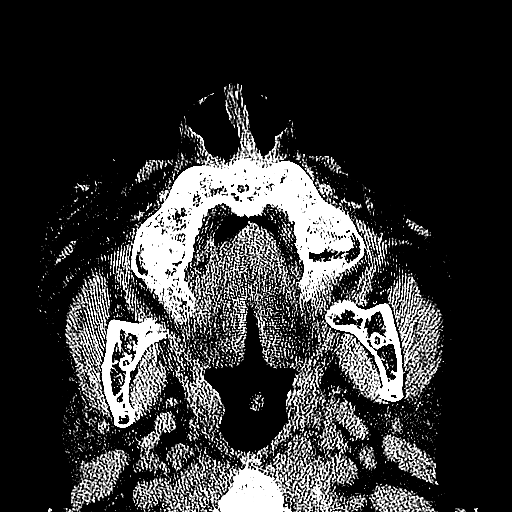
[im 32/57  brain]
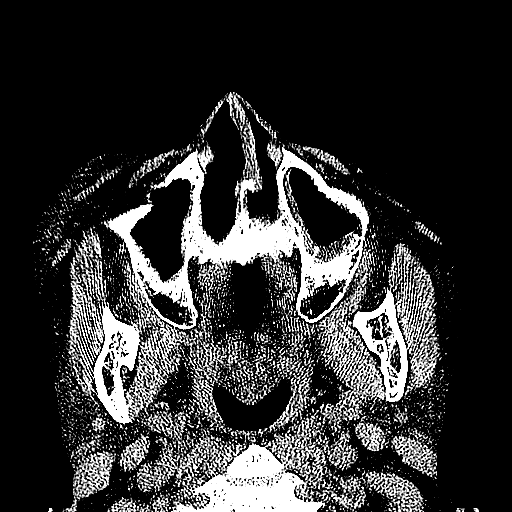
[im 32/57  bone]
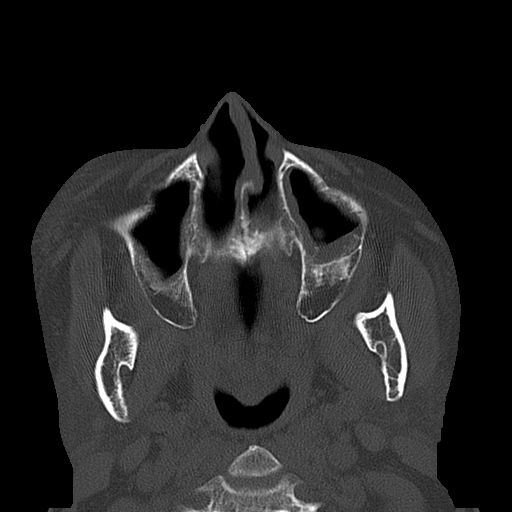
[im 35/57  brain]
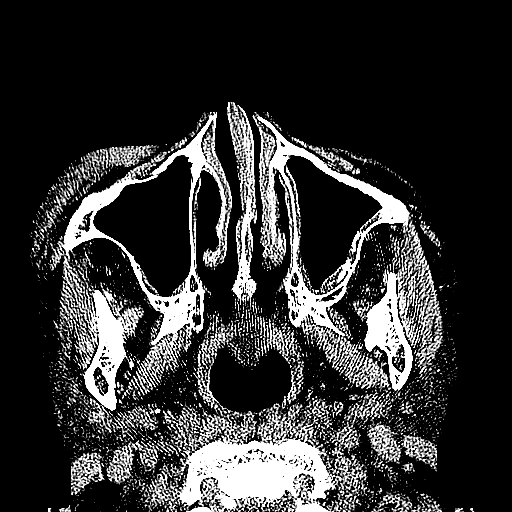
[im 38/57  brain]
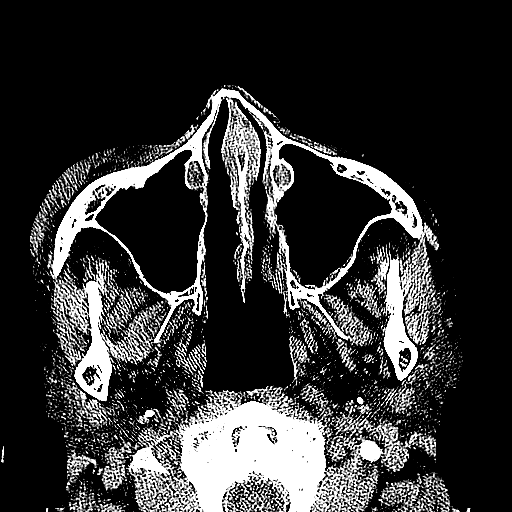
[im 44/57  brain]
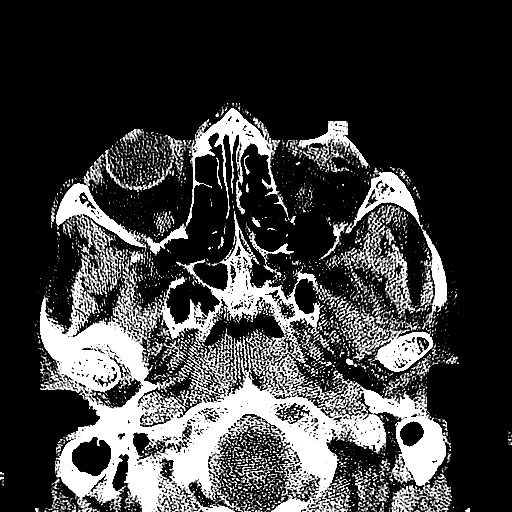
[im 47/57  brain]
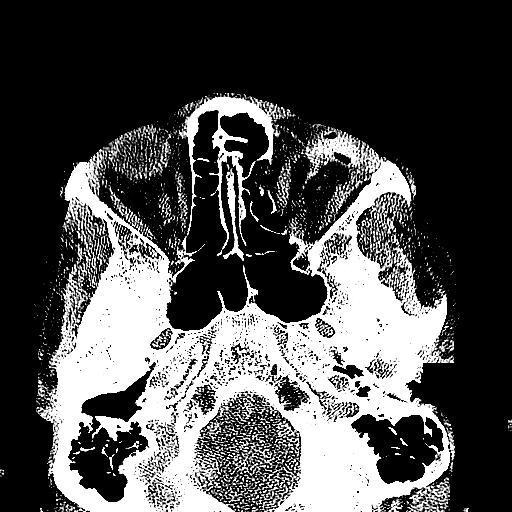
[im 47/57  bone]
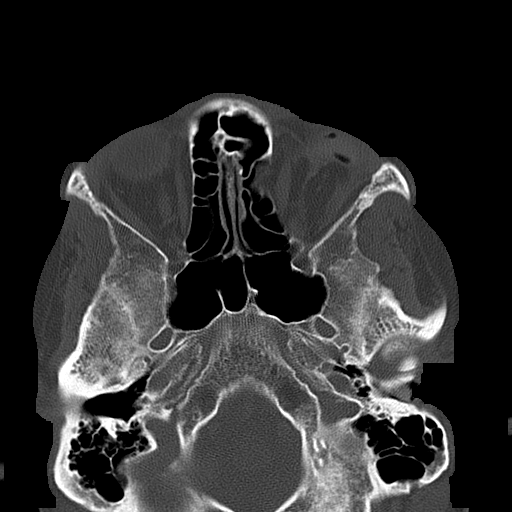
[im 50/57  brain]
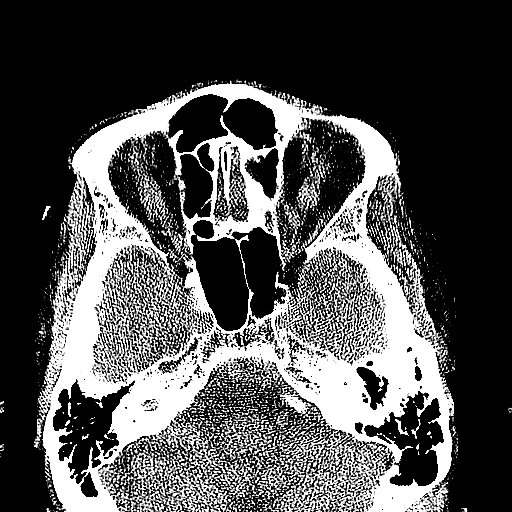
[im 53/57  brain]
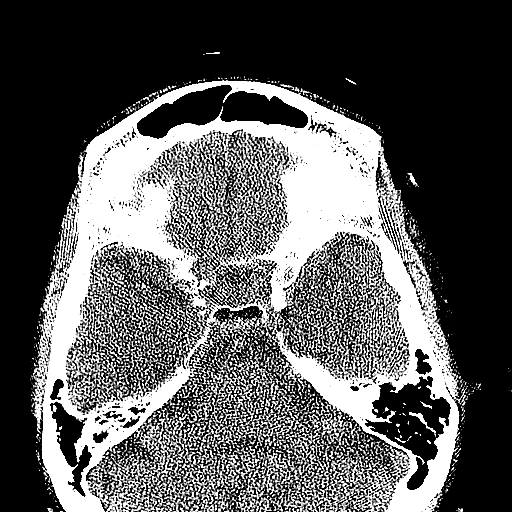

[15 of 30 positions shown; findings below may reference images not displayed]

FINDINGS: There is no evidence of mass effect, midline shift, or extra-axial fluid
collections.  There is no evidence of a space-occupying lesion or
intracranial hemorrhage. There is no evidence of a cortical-based area of
acute infarction. There is generalized cerebral atrophy. There is
periventricular white matter low attenuation likely secondary to
microangiopathy.

The ventricles and sulci are appropriate for the patient's age. The basal
cisterns are patent.

Visualized portions of the orbits are unremarkable. The visualized portions
of the paranasal sinuses and mastoid air cells are unremarkable.
Cerebrovascular atherosclerotic calcifications are noted.

The osseous structures are unremarkable.
IMPRESSION: No acute intracranial process.

[REDACTED]
